# Patient Record
Sex: Female | Born: 1995 | Race: Black or African American | Hispanic: No | Smoking: Former smoker
Health system: Southern US, Community
[De-identification: ages and names within clinical notes are randomized; demographics above are authoritative.]

## PROBLEM LIST (undated history)

## (undated) ENCOUNTER — Inpatient Hospital Stay (HOSPITAL_COMMUNITY): Payer: Self-pay

## (undated) ENCOUNTER — Inpatient Hospital Stay (HOSPITAL_COMMUNITY)

## (undated) DIAGNOSIS — R51 Headache: Secondary | ICD-10-CM

## (undated) DIAGNOSIS — F419 Anxiety disorder, unspecified: Secondary | ICD-10-CM

## (undated) DIAGNOSIS — O008 Other ectopic pregnancy without intrauterine pregnancy: Secondary | ICD-10-CM

## (undated) DIAGNOSIS — A6000 Herpesviral infection of urogenital system, unspecified: Secondary | ICD-10-CM

## (undated) DIAGNOSIS — J302 Other seasonal allergic rhinitis: Secondary | ICD-10-CM

## (undated) DIAGNOSIS — R519 Headache, unspecified: Secondary | ICD-10-CM

## (undated) DIAGNOSIS — Z9889 Other specified postprocedural states: Secondary | ICD-10-CM

## (undated) HISTORY — PX: WISDOM TOOTH EXTRACTION: SHX21

---
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
## 2015-09-18 ENCOUNTER — Encounter (HOSPITAL_COMMUNITY): Payer: Self-pay | Admitting: Emergency Medicine

## 2015-09-18 ENCOUNTER — Ambulatory Visit (HOSPITAL_COMMUNITY)
Admission: EM | Admit: 2015-09-18 | Discharge: 2015-09-18 | Disposition: A | Discharge status: Home / Self Care | Payer: Medicaid Other | Attending: Family Medicine | Admitting: Family Medicine

## 2015-09-18 DIAGNOSIS — T300 Burn of unspecified body region, unspecified degree: Secondary | ICD-10-CM

## 2015-09-18 MED ORDER — SILVER SULFADIAZINE 1 % EX CREA: 1.0000 "application " | TOPICAL_CREAM | Freq: Every day | CUTANEOUS | Status: DC

## 2015-09-18 NOTE — ED Notes (Signed)
Burned left anterior thigh with hot soup.  Skin is peeling, and there are areas for blisters.

## 2015-09-18 NOTE — ED Provider Notes (Signed)
CSN: [id number]     Arrival date & time 09/18/15  1820 History   First MD Initiated Contact with Patient 09/18/15 1847     No chief complaint on file.  (Consider location/radiation/quality/duration/timing/severity/associated sxs/prior Treatment) HPI History obtained from patient: Location:  Left thigh Context/Duration:  About 1 hour ago, spilled hot soup onto thigh while at work. Severity: 4  Quality:burning Timing:   constant         Home Treatment: ice and OTC burn creme Associated symptoms:  Tip of finger burned also Family History:    Past Medical History  Diagnosis Date  . Seasonal allergies   . Headache    Past Surgical History  Procedure Laterality Date  . Wisdom tooth extraction    . No past surgeries     No family history on file. Social History  Substance Use Topics  . Smoking status: Former Smoker    Quit date: 10/18/2012  . Smokeless tobacco: None  . Alcohol Use: No   OB History    Gravida Para Term Preterm AB TAB SAB Ectopic Multiple Living   1 1 1       0 1     Review of Systems  Denies: HEADACHE, NAUSEA, ABDOMINAL PAIN, CHEST PAIN, CONGESTION, DYSURIA, SHORTNESS OF BREATH     Allergies  Review of patient's allergies indicates no known allergies.  Home Medications   Prior to Admission medications   Not on File   Meds Ordered and Administered this Visit  Medications - No data to display  BP 136/86 mmHg  Pulse 90  Temp(Src) 98.9 F (37.2 C) (Oral)  Resp 16  SpO2 100% No data found.   Physical Exam NURSES NOTES AND VITAL SIGNS REVIEWED. CONSTITUTIONAL: Well developed, well nourished, no acute distress HEENT: normocephalic, atraumatic EYES: Conjunctiva normal NECK:normal ROM, supple, no adenopathy PULMONARY:No respiratory distress, normal effort ABDOMINAL: Soft, ND, NT BS+, No CVAT MUSCULOSKELETAL: Normal ROM of all extremities, left anterior thigh 3 cm second degree burn with blisters. Appears to be a splash burn. SKIN: warm and dry  without rash PSYCHIATRIC: Mood and affect, behavior are normal  ED Course  Procedures (including critical care time)  Labs Review Labs Reviewed - No data to display  Imaging Review No results found.   Visual Acuity Review  Right Eye Distance:   Left Eye Distance:   Bilateral Distance:    Right Eye Near:   Left Eye Near:    Bilateral Near:       Silvadene ointment Follow up Thursday for burn Debridement  MDM   1. Burn     Patient is reassured that there are no issues that require transfer to higher level of care at this time or additional tests. Patient is advised to continue home symptomatic treatment. Patient is advised that if there are new or worsening symptoms to attend the emergency department, contact primary care provider, or return to UC. Instructions of care provided discharged home in stable condition.    THIS NOTE WAS GENERATED USING A VOICE RECOGNITION SOFTWARE PROGRAM. ALL REASONABLE EFFORTS  WERE MADE TO PROOFREAD THIS DOCUMENT FOR ACCURACY.  I have verbally reviewed the discharge instructions with the patient. A printed AVS was given to the patient.  All questions were answered prior to discharge.      Tharon AquasFrank C Patrick, PA 09/18/15 1930

## 2015-09-18 NOTE — Discharge Instructions (Signed)
Burn Care °Burns hurt your skin. When your skin is hurt, it is easier to get an infection. Follow your doctor's directions to help prevent an infection. °HOME CARE °· Wash your hands well before you change your bandage. °· Change your bandage as often as told by your doctor. °¨ Remove the old bandage. If the bandage sticks, soak it off with cool, clean water. °¨ Gently clean the burn with mild soap and water. °¨ Pat the burn dry with a clean, dry cloth. °¨ Put a thin layer of medicated cream on the burn. °¨ Put a clean bandage on as told by your doctor. °¨ Keep the bandage clean and dry. °· Raise (elevate) the burn for the first 24 hours. After that, follow your doctor's directions. °· Only take medicine as told by your doctor. °GET HELP RIGHT AWAY IF:  °· You have too much pain. °· The skin near the burn is red, tender, puffy (swollen), or has red streaks. °· The burn area has yellowish [place] fluid (pus) or a bad smell coming from it. °· You have a fever. °MAKE SURE YOU:  °· Understand these instructions. °· Will watch your condition. °· Will get help right away if you are not doing well or get worse. °  °This information is not intended to replace advice given to you by your health care provider. Make sure you discuss any questions you have with your health care provider. °  °Document Released: 12/18/2007 Document Revised: 06/02/2011 Document Reviewed: 07/31/2010 °Elsevier Interactive Patient Education ©2016 [employer] ° °

## 2015-09-20 ENCOUNTER — Encounter (HOSPITAL_COMMUNITY): Payer: Self-pay | Admitting: Emergency Medicine

## 2015-09-20 ENCOUNTER — Ambulatory Visit (HOSPITAL_COMMUNITY)
Admission: EM | Admit: 2015-09-20 | Discharge: 2015-09-20 | Disposition: A | Discharge status: Home / Self Care | Payer: Medicaid Other | Attending: Family Medicine | Admitting: Family Medicine

## 2015-09-20 DIAGNOSIS — T24012D Burn of unspecified degree of left thigh, subsequent encounter: Secondary | ICD-10-CM

## 2015-09-20 MED ORDER — SILVER SULFADIAZINE 1 % EX CREA
TOPICAL_CREAM | Freq: Once | CUTANEOUS | Status: AC
  Administered 2015-09-20: 17:00:00 via TOPICAL

## 2015-09-20 NOTE — ED Provider Notes (Signed)
CSN: [id number]     Arrival date & time 09/20/15  1548 History   None    Chief Complaint  Patient presents with  . Follow-up   (Consider location/radiation/quality/duration/timing/severity/associated sxs/prior Treatment) Patient is a 20 y.o. female presenting with burn.  Burn Burn location:  Leg Leg burn location:  L upper leg Burn quality:  Red and painful Time since incident:  2 days Progression:  Improving Pain details:    Severity:  Mild   Duration:  2 days   Timing:  Constant   Progression:  Unchanged Mechanism of burn:  Hot liquid Incident location:  Kitchen Relieved by:  Salve Worsened by:  Removing the blister Ineffective treatments:  None tried Tetanus status:  Up to date   Past Medical History  Diagnosis Date  . Seasonal allergies   . Headache    Past Surgical History  Procedure Laterality Date  . Wisdom tooth extraction    . No past surgeries     No family history on file. Social History  Substance Use Topics  . Smoking status: Former Smoker    Quit date: 10/18/2012  . Smokeless tobacco: None  . Alcohol Use: No   OB History    Gravida Para Term Preterm AB TAB SAB Ectopic Multiple Living   1 1 1       0 1     Review of Systems  Constitutional: Negative.   HENT: Negative.   Eyes: Negative.   Respiratory: Negative.   Cardiovascular: Negative.   Gastrointestinal: Negative.   Endocrine: Negative.   Genitourinary: Negative.   Musculoskeletal: Negative.   Skin: Positive for wound.  Allergic/Immunologic: Negative.   Neurological: Negative.   Hematological: Negative.   Psychiatric/Behavioral: Negative.     Allergies  Review of patient's allergies indicates no known allergies.  Home Medications   Prior to Admission medications   Medication Sig Start Date End Date Taking? Authorizing Provider  silver sulfADIAZINE (SILVADENE) 1 % cream Apply 1 application topically daily. 09/18/15   Tharon AquasFrank C Patrick, PA   Meds Ordered and Administered this  Visit   Medications  silver sulfADIAZINE (SILVADENE) 1 % cream ( Topical Given 09/20/15 1720)    BP 122/81 mmHg  Pulse 80  Temp(Src) 98.1 F (36.7 C) (Oral)  Resp 16  SpO2 100%  LMP 09/16/2015  Breastfeeding? No No data found.   Physical Exam  Constitutional: She appears well-developed and well-nourished.  HENT:  Head: Normocephalic and atraumatic.  Right Ear: External ear normal.  Left Ear: External ear normal.  Mouth/Throat: Oropharynx is clear and moist.  Eyes: Conjunctivae are normal.  Neck: Normal range of motion. Neck supple.  Cardiovascular: Normal rate, regular rhythm and normal heart sounds.   Pulmonary/Chest: Effort normal and breath sounds normal.  Abdominal: Soft. Bowel sounds are normal.  Skin:  Left upper thigh with area approx 3 cm diameter with erythema and clear drainage and tenderness.  No erythema surrounding.  Surrounding skin slightly peeled back but no eschar or abnormal tissue on the burn wound.  The wound is healing well.  The surrounding tissue is w/o blistering.  The skin is hyperpigemented.    ED Course  Procedures (including critical care time)  Labs Review Labs Reviewed - No data to display  Imaging Review No results found.   Visual Acuity Review  Right Eye Distance:   Left Eye Distance:   Bilateral Distance:    Right Eye Near:   Left Eye Near:    Bilateral Near:  MDM   1. Burn of left thigh, unspecified degree, subsequent encounter    Silvadene ointment applied to left thigh burn and then nonadaptic or telfa pad then coban wrap placed. Patient advised to take tylenol and motrin over the counter as directed for pain.  Continue daily dressing changes.  Follow up prn.    Deatra CanterWilliam J Oxford, FNP 09/20/15 1730

## 2015-09-20 NOTE — ED Notes (Addendum)
Here for a f/u to a 2nd degree burn to LLE... Voices no new concern.... A&O x4... NAD

## 2015-09-20 NOTE — Discharge Instructions (Signed)
Burn Care °Burns hurt your skin. When your skin is hurt, it is easier to get an infection. Follow your doctor's directions to help prevent an infection. °HOME CARE °· Wash your hands well before you change your bandage. °· Change your bandage as often as told by your doctor. °¨ Remove the old bandage. If the bandage sticks, soak it off with cool, clean water. °¨ Gently clean the burn with mild soap and water. °¨ Pat the burn dry with a clean, dry cloth. °¨ Put a thin layer of medicated cream on the burn. °¨ Put a clean bandage on as told by your doctor. °¨ Keep the bandage clean and dry. °· Raise (elevate) the burn for the first 24 hours. After that, follow your doctor's directions. °· Only take medicine as told by your doctor. °GET HELP RIGHT AWAY IF:  °· You have too much pain. °· The skin near the burn is red, tender, puffy (swollen), or has red streaks. °· The burn area has yellowish [place] fluid (pus) or a bad smell coming from it. °· You have a fever. °MAKE SURE YOU:  °· Understand these instructions. °· Will watch your condition. °· Will get help right away if you are not doing well or get worse. °  °This information is not intended to replace advice given to you by your health care provider. Make sure you discuss any questions you have with your health care provider. °  °Document Released: 12/18/2007 Document Revised: 06/02/2011 Document Reviewed: 07/31/2010 °Elsevier Interactive Patient Education ©2016 Elsevier Inc. ° °

## 2015-09-20 NOTE — ED Notes (Signed)
Applied silvadene and 4x3 telfa and secured w/4 in coban

## 2016-12-22 ENCOUNTER — Encounter (HOSPITAL_COMMUNITY): Payer: Self-pay | Admitting: Emergency Medicine

## 2016-12-22 ENCOUNTER — Emergency Department (HOSPITAL_COMMUNITY)
Admission: EM | Admit: 2016-12-22 | Discharge: 2016-12-22 | Disposition: A | Discharge status: Home / Self Care | Payer: Medicaid Other | Attending: Emergency Medicine | Admitting: Emergency Medicine

## 2016-12-22 DIAGNOSIS — Z87891 Personal history of nicotine dependence: Secondary | ICD-10-CM | POA: Diagnosis not present

## 2016-12-22 DIAGNOSIS — Z79899 Other long term (current) drug therapy: Secondary | ICD-10-CM | POA: Diagnosis not present

## 2016-12-22 DIAGNOSIS — B009 Herpesviral infection, unspecified: Secondary | ICD-10-CM

## 2016-12-22 DIAGNOSIS — R102 Pelvic and perineal pain: Secondary | ICD-10-CM | POA: Insufficient documentation

## 2016-12-22 LAB — URINALYSIS, ROUTINE W REFLEX MICROSCOPIC
BILIRUBIN URINE: NEGATIVE
Glucose, UA: NEGATIVE mg/dL
Hgb urine dipstick: NEGATIVE
Ketones, ur: NEGATIVE mg/dL
Nitrite: NEGATIVE
PH: 5 (ref 5.0–8.0)
Protein, ur: NEGATIVE mg/dL
Specific Gravity, Urine: 1.023 (ref 1.005–1.030)

## 2016-12-22 LAB — POC URINE PREG, ED: PREG TEST UR: NEGATIVE

## 2016-12-22 MED ORDER — ACYCLOVIR 400 MG PO TABS: 400.0000 mg | ORAL_TABLET | Freq: Every day | ORAL | 0 refills | Status: AC

## 2016-12-22 NOTE — ED Provider Notes (Signed)
[facility]-EMERGENCY DEPT Provider Note   CSN: [id number] Arrival date & time: 12/22/16  1111     History   Chief Complaint Chief Complaint  Patient presents with  . Pelvic Pain    HPI Sara Rubio is a 21 y.o. female.  HPI  The patient is a 21 year old female who states that she is otherwise healthy, she was tested approximately one week ago for STDs at the [facility] because of increased vaginal discharge and dysuria. Ultimately she was given sulfamethoxazole for a UTI and Flagyl for a vaginal infection but because she was drinking alcohol she has not taken the Flagyl until today. She has now developed a left sided labial lesion which is blistering and tender to the touch and is concerned that she may have herpes. She also complains of mild left lower quadrant discomfort which has been persistent over the last couple of weeks. It is not worsening. No fevers chills nausea vomiting dysuria or diarrhea or rectal bleeding.  Past Medical History:  Diagnosis Date  . Headache   . Seasonal allergies     Patient Active Problem List   Diagnosis Date Noted  . Normal labor 10/20/2014  . NVD (normal vaginal delivery) 10/20/2014  . Ketonuria 04/24/2014  . Nausea and vomiting during pregnancy 04/24/2014    Past Surgical History:  Procedure Laterality Date  . NO PAST SURGERIES    . WISDOM TOOTH EXTRACTION      OB History    Gravida Para Term Preterm AB Living   SAB TAB Ectopic Multiple Live Births         0 1       Home Medications    Prior to Admission medications   Medication Sig Start Date End Date Taking? Authorizing Provider  acyclovir (ZOVIRAX) 400 MG tablet Take 1 tablet (400 mg total) by mouth 5 (five) times daily. 12/22/16 12/29/16  Eber Hong, MD  silver sulfADIAZINE (SILVADENE) 1 % cream Apply 1 application topically daily. 09/18/15   Tharon Aquas, PA    Family History History reviewed. No pertinent family history.  Social  History Social History  Substance Use Topics  . Smoking status: Former Smoker    Quit date: 10/18/2012  . Smokeless tobacco: Never Used  . Alcohol use No     Allergies   Patient has no known allergies.   Review of Systems Review of Systems  Constitutional: Negative for chills and fever.  Respiratory: Negative for cough and shortness of breath.   Cardiovascular: Negative for chest pain.  Gastrointestinal: Positive for abdominal pain. Negative for constipation, diarrhea, nausea, rectal pain and vomiting.  Genitourinary: Positive for dyspareunia, genital sores and vaginal discharge. Negative for difficulty urinating, dysuria and flank pain.  Musculoskeletal: Negative for back pain.  Skin: Positive for rash.     Physical Exam Updated Vital Signs BP (!) 123/56 (BP Location: Left Arm)   Pulse (!) 102   Temp 98.5 F (36.9 C) (Oral)   Resp 20   LMP 12/09/2016   SpO2 100%   Physical Exam  Constitutional: She appears well-developed and well-nourished. No distress.  HENT:  Head: Normocephalic and atraumatic.  Mouth/Throat: Oropharynx is clear and moist. No oropharyngeal exudate.  Eyes: Pupils are equal, round, and reactive to light. Conjunctivae and EOM are normal. Right eye exhibits no discharge. Left eye exhibits no discharge. No scleral icterus.  Neck: Normal range of motion. Neck supple. No JVD present. No thyromegaly present.  Cardiovascular: Normal rate, regular rhythm, normal heart sounds and intact distal pulses.  Exam reveals no gallop and no friction rub.   No murmur heard. Pulmonary/Chest: Effort normal and breath sounds normal. No respiratory distress. She has no wheezes. She has no rales.  Abdominal: Soft. Bowel sounds are normal. She exhibits no distension and no mass. There is tenderness ( Patient states she has mild left lower quadrant tenderness but no guarding masses or peritoneal signs. Essentially nontender on exam).  Genitourinary:  Genitourinary Comments:  Chaperone present for exam, there is a slight blistering-type lesion in the left labia just to the outside of this area. There is no open wounds or ulcers. There is a small mount of yellow vaginal discharge, there is no other labial abscesses present.  Musculoskeletal: Normal range of motion. She exhibits no edema or tenderness.  Lymphadenopathy:    She has no cervical adenopathy.  Neurological: She is alert. Coordination normal.  Skin: Skin is warm and dry. No rash noted. No erythema.  Psychiatric: She has a normal mood and affect. Her behavior is normal.  Nursing note and vitals reviewed.    ED Treatments / Results  Labs (all labs ordered are listed, but only abnormal results are displayed) Labs Reviewed  URINALYSIS, ROUTINE W REFLEX MICROSCOPIC - Abnormal; Notable for the following:       Result Value   APPearance HAZY (*)    Leukocytes, UA LARGE (*)    Bacteria, UA RARE (*)    Squamous Epithelial / LPF 0-5 (*)    All other components within normal limits  POC URINE PREG, ED    Radiology No results found.  Procedures Procedures (including critical care time)  Medications Ordered in ED Medications - No data to display   Initial Impression / Assessment and Plan / ED Course  I have reviewed the triage vital signs and the nursing notes.  Pertinent labs & imaging results that were available during my care of the patient were reviewed by me and considered in my medical decision making (see chart for details).     The patient has what appears to be herpes simplex, she will be treated with acyclovir. She was informed of the treatment protocols as well as her need to practice safe sex. She will be given prescriptions for acyclovir. Follow up with health department. She states that she tested negative for gonorrhea Chlamydia syphilis and HIV while she was there last week. This rash seems to have come up since that time. She does not know of anybody that might of given it to  her.  Final Clinical Impressions(s) / ED Diagnoses   Final diagnoses:  Pelvic pain in female  Herpes simplex infection    New Prescriptions New Prescriptions   ACYCLOVIR (ZOVIRAX) 400 MG TABLET    Take 1 tablet (400 mg total) by mouth 5 (five) times daily.     Eber Hong, MD 12/22/16 [phone]

## 2016-12-22 NOTE — Discharge Instructions (Signed)
[employer] - for coupons  Your rash is similar to the rash that you get with herpes. Please take acyclovir, 400 mg 5 times a day for the next 7 days. Please read the attached instructions  you will need to be formally diagnosed at a place like the health Department where they can test for infections like herpes.

## 2016-12-22 NOTE — ED Triage Notes (Signed)
Pt to ER, reports pelvic pain x1 week, was seen for same last week and diagnosed with BV, only began medications for treatment today. Reports vaginal discharge. LMP 09/18

## 2017-05-13 ENCOUNTER — Encounter: Payer: Self-pay | Admitting: *Deleted

## 2017-05-13 ENCOUNTER — Ambulatory Visit (INDEPENDENT_AMBULATORY_CARE_PROVIDER_SITE_OTHER): Payer: Medicaid Other | Admitting: *Deleted

## 2017-05-13 DIAGNOSIS — Z3201 Encounter for pregnancy test, result positive: Secondary | ICD-10-CM | POA: Diagnosis present

## 2017-05-13 DIAGNOSIS — Z3200 Encounter for pregnancy test, result unknown: Secondary | ICD-10-CM

## 2017-05-13 LAB — POCT PREGNANCY, URINE: PREG TEST UR: POSITIVE — AB

## 2017-05-13 NOTE — Progress Notes (Signed)
Here for pregnancy test which was postive. States LMP 04/05/17. States sure of LMP and has regular periods. Reviewed meds with her.Verified with provider Judeth HornErin Lawrence, NP valtrex ok to take in pregnancy if needed.  Encouraged her to start prenatal care asap.

## 2017-05-13 NOTE — Progress Notes (Signed)
Chart reviewed for nurse visit. Agree with plan of care.   Judeth HornLawrence, Melony Tenpas, NP 05/13/2017 12:33 PM

## 2017-05-16 ENCOUNTER — Inpatient Hospital Stay (HOSPITAL_COMMUNITY): Payer: Medicaid Other | Admitting: Anesthesiology

## 2017-05-16 ENCOUNTER — Encounter (HOSPITAL_COMMUNITY): Payer: Self-pay | Admitting: *Deleted

## 2017-05-16 ENCOUNTER — Inpatient Hospital Stay (HOSPITAL_COMMUNITY)
Admission: AD | Admit: 2017-05-16 | Discharge: 2017-05-17 | DRG: 817 | Disposition: A | Discharge status: Home / Self Care | Payer: Medicaid Other | Source: Ambulatory Visit | Attending: Obstetrics & Gynecology | Admitting: Obstetrics & Gynecology

## 2017-05-16 ENCOUNTER — Other Ambulatory Visit: Payer: Self-pay

## 2017-05-16 ENCOUNTER — Inpatient Hospital Stay (HOSPITAL_COMMUNITY): Payer: Medicaid Other

## 2017-05-16 ENCOUNTER — Encounter (HOSPITAL_COMMUNITY)
Admission: AD | Disposition: A | Discharge status: Home / Self Care | Payer: Self-pay | Source: Ambulatory Visit | Attending: Obstetrics & Gynecology

## 2017-05-16 DIAGNOSIS — R109 Unspecified abdominal pain: Secondary | ICD-10-CM

## 2017-05-16 DIAGNOSIS — O008 Other ectopic pregnancy without intrauterine pregnancy: Secondary | ICD-10-CM | POA: Diagnosis present

## 2017-05-16 DIAGNOSIS — O00102 Left tubal pregnancy without intrauterine pregnancy: Principal | ICD-10-CM | POA: Diagnosis present

## 2017-05-16 DIAGNOSIS — Z87891 Personal history of nicotine dependence: Secondary | ICD-10-CM

## 2017-05-16 DIAGNOSIS — O98511 Other viral diseases complicating pregnancy, first trimester: Secondary | ICD-10-CM

## 2017-05-16 DIAGNOSIS — A6000 Herpesviral infection of urogenital system, unspecified: Secondary | ICD-10-CM | POA: Diagnosis present

## 2017-05-16 DIAGNOSIS — O26891 Other specified pregnancy related conditions, first trimester: Secondary | ICD-10-CM

## 2017-05-16 DIAGNOSIS — K661 Hemoperitoneum: Secondary | ICD-10-CM | POA: Diagnosis present

## 2017-05-16 DIAGNOSIS — Z3A01 Less than 8 weeks gestation of pregnancy: Secondary | ICD-10-CM

## 2017-05-16 DIAGNOSIS — B009 Herpesviral infection, unspecified: Secondary | ICD-10-CM

## 2017-05-16 DIAGNOSIS — F419 Anxiety disorder, unspecified: Secondary | ICD-10-CM | POA: Diagnosis present

## 2017-05-16 DIAGNOSIS — Z9889 Other specified postprocedural states: Secondary | ICD-10-CM

## 2017-05-16 HISTORY — PX: UNILATERAL SALPINGECTOMY: SHX6160

## 2017-05-16 HISTORY — DX: Other ectopic pregnancy without intrauterine pregnancy: O00.80

## 2017-05-16 HISTORY — DX: Herpesviral infection of urogenital system, unspecified: A60.00

## 2017-05-16 HISTORY — DX: Other specified postprocedural states: Z98.890

## 2017-05-16 HISTORY — PX: LAPAROTOMY: SHX154

## 2017-05-16 HISTORY — DX: Anxiety disorder, unspecified: F41.9

## 2017-05-16 HISTORY — PX: LAPAROSCOPY: SHX197

## 2017-05-16 LAB — CBC WITH DIFFERENTIAL/PLATELET
BASOS PCT: 0 %
Basophils Absolute: 0 10*3/uL (ref 0.0–0.1)
EOS ABS: 0 10*3/uL (ref 0.0–0.7)
Eosinophils Relative: 0 %
HCT: 36.3 % (ref 36.0–46.0)
Hemoglobin: 12.6 g/dL (ref 12.0–15.0)
LYMPHS ABS: 2.1 10*3/uL (ref 0.7–4.0)
Lymphocytes Relative: 25 %
MCH: 31.3 pg (ref 26.0–34.0)
MCHC: 34.7 g/dL (ref 30.0–36.0)
MCV: 90.3 fL (ref 78.0–100.0)
MONO ABS: 0.3 10*3/uL (ref 0.1–1.0)
Monocytes Relative: 3 %
Neutro Abs: 6 10*3/uL (ref 1.7–7.7)
Neutrophils Relative %: 72 %
Platelets: 246 10*3/uL (ref 150–400)
RBC: 4.02 MIL/uL (ref 3.87–5.11)
RDW: 12.7 % (ref 11.5–15.5)
WBC: 8.4 10*3/uL (ref 4.0–10.5)

## 2017-05-16 LAB — WET PREP, GENITAL
Sperm: NONE SEEN
Trich, Wet Prep: NONE SEEN

## 2017-05-16 LAB — URINALYSIS, ROUTINE W REFLEX MICROSCOPIC
BACTERIA UA: NONE SEEN
BILIRUBIN URINE: NEGATIVE
GLUCOSE, UA: NEGATIVE mg/dL
HGB URINE DIPSTICK: NEGATIVE
KETONES UR: NEGATIVE mg/dL
NITRITE: NEGATIVE
PROTEIN: NEGATIVE mg/dL
Specific Gravity, Urine: 1.019 (ref 1.005–1.030)
pH: 5 (ref 5.0–8.0)

## 2017-05-16 LAB — PREPARE RBC (CROSSMATCH)

## 2017-05-16 LAB — POCT PREGNANCY, URINE: Preg Test, Ur: POSITIVE — AB

## 2017-05-16 LAB — HCG, QUANTITATIVE, PREGNANCY: hCG, Beta Chain, Quant, S: 23920 m[IU]/mL — ABNORMAL HIGH (ref <5)

## 2017-05-16 SURGERY — LAPAROSCOPY OPERATIVE
Anesthesia: General | Site: Abdomen

## 2017-05-16 MED ORDER — FAMOTIDINE IN NACL 20-0.9 MG/50ML-% IV SOLN
20.0000 mg | Freq: Once | INTRAVENOUS | Status: AC
  Administered 2017-05-16: 20 mg via INTRAVENOUS
  Filled 2017-05-16: qty 50

## 2017-05-16 MED ORDER — HYDROMORPHONE HCL 1 MG/ML IJ SOLN
INTRAMUSCULAR | Status: AC
  Filled 2017-05-16: qty 1

## 2017-05-16 MED ORDER — PROPOFOL 10 MG/ML IV BOLUS
INTRAVENOUS | Status: DC | PRN
  Administered 2017-05-16: 200 mg via INTRAVENOUS

## 2017-05-16 MED ORDER — ROCURONIUM BROMIDE 100 MG/10ML IV SOLN
INTRAVENOUS | Status: AC
  Filled 2017-05-16: qty 1

## 2017-05-16 MED ORDER — PROPOFOL 10 MG/ML IV BOLUS
INTRAVENOUS | Status: AC
  Filled 2017-05-16: qty 20

## 2017-05-16 MED ORDER — ONDANSETRON HCL 4 MG/2ML IJ SOLN
INTRAMUSCULAR | Status: AC
  Filled 2017-05-16: qty 2

## 2017-05-16 MED ORDER — FENTANYL CITRATE (PF) 100 MCG/2ML IJ SOLN
INTRAMUSCULAR | Status: AC
  Filled 2017-05-16: qty 2

## 2017-05-16 MED ORDER — LORAZEPAM 2 MG/ML IJ SOLN
1.0000 mg | Freq: Four times a day (QID) | INTRAMUSCULAR | Status: DC | PRN
  Administered 2017-05-16: 1 mg via INTRAVENOUS
  Filled 2017-05-16 (×2): qty 0.5

## 2017-05-16 MED ORDER — BUPIVACAINE HCL (PF) 0.5 % IJ SOLN
INTRAMUSCULAR | Status: DC | PRN
  Administered 2017-05-16: 2 mL
  Administered 2017-05-16: 28 mL

## 2017-05-16 MED ORDER — ROCURONIUM BROMIDE 100 MG/10ML IV SOLN
INTRAVENOUS | Status: DC | PRN
  Administered 2017-05-16: 30 mg via INTRAVENOUS
  Administered 2017-05-16: 20 mg via INTRAVENOUS

## 2017-05-16 MED ORDER — DEXAMETHASONE SODIUM PHOSPHATE 10 MG/ML IJ SOLN
INTRAMUSCULAR | Status: DC | PRN
  Administered 2017-05-16: 4 mg via INTRAVENOUS

## 2017-05-16 MED ORDER — SUGAMMADEX SODIUM 200 MG/2ML IV SOLN
INTRAVENOUS | Status: DC | PRN
  Administered 2017-05-16: 200 mg via INTRAVENOUS

## 2017-05-16 MED ORDER — OXYCODONE-ACETAMINOPHEN 5-325 MG PO TABS
1.0000 | ORAL_TABLET | Freq: Once | ORAL | Status: AC
  Administered 2017-05-16: 1 via ORAL
  Filled 2017-05-16: qty 1

## 2017-05-16 MED ORDER — LIDOCAINE HCL (CARDIAC) 20 MG/ML IV SOLN
INTRAVENOUS | Status: DC | PRN
  Administered 2017-05-16: 100 mg via INTRAVENOUS

## 2017-05-16 MED ORDER — SCOPOLAMINE 1 MG/3DAYS TD PT72
MEDICATED_PATCH | TRANSDERMAL | Status: DC | PRN
  Administered 2017-05-16: 1 via TRANSDERMAL

## 2017-05-16 MED ORDER — LACTATED RINGERS IV SOLN: INTRAVENOUS | Status: DC

## 2017-05-16 MED ORDER — FENTANYL CITRATE (PF) 100 MCG/2ML IJ SOLN
INTRAMUSCULAR | Status: DC | PRN
  Administered 2017-05-16: 100 ug via INTRAVENOUS
  Administered 2017-05-16: 50 ug via INTRAVENOUS
  Administered 2017-05-16: 25 ug via INTRAVENOUS
  Administered 2017-05-16: 50 ug via INTRAVENOUS
  Administered 2017-05-16: 25 ug via INTRAVENOUS
  Administered 2017-05-16 (×2): 100 ug via INTRAVENOUS

## 2017-05-16 MED ORDER — MIDAZOLAM HCL 2 MG/2ML IJ SOLN
INTRAMUSCULAR | Status: DC | PRN
  Administered 2017-05-16: 2 mg via INTRAVENOUS

## 2017-05-16 MED ORDER — MIDAZOLAM HCL 2 MG/2ML IJ SOLN
INTRAMUSCULAR | Status: AC
  Filled 2017-05-16: qty 2

## 2017-05-16 MED ORDER — GLYCOPYRROLATE 0.2 MG/ML IJ SOLN
INTRAMUSCULAR | Status: AC
  Filled 2017-05-16: qty 1

## 2017-05-16 MED ORDER — LIDOCAINE HCL (CARDIAC) 20 MG/ML IV SOLN
INTRAVENOUS | Status: AC
  Filled 2017-05-16: qty 5

## 2017-05-16 MED ORDER — SUGAMMADEX SODIUM 200 MG/2ML IV SOLN
INTRAVENOUS | Status: AC
  Filled 2017-05-16: qty 2

## 2017-05-16 MED ORDER — MEPERIDINE HCL 25 MG/ML IJ SOLN
6.2500 mg | INTRAMUSCULAR | Status: DC | PRN
  Administered 2017-05-16 (×2): 12.5 mg via INTRAVENOUS

## 2017-05-16 MED ORDER — SOD CITRATE-CITRIC ACID 500-334 MG/5ML PO SOLN
30.0000 mL | Freq: Once | ORAL | Status: AC
  Administered 2017-05-16: 30 mL via ORAL
  Filled 2017-05-16: qty 15

## 2017-05-16 MED ORDER — MEPERIDINE HCL 25 MG/ML IJ SOLN
INTRAMUSCULAR | Status: AC
  Filled 2017-05-16: qty 1

## 2017-05-16 MED ORDER — DEXAMETHASONE SODIUM PHOSPHATE 4 MG/ML IJ SOLN
INTRAMUSCULAR | Status: AC
  Filled 2017-05-16: qty 1

## 2017-05-16 MED ORDER — CEFAZOLIN SODIUM-DEXTROSE 2-4 GM/100ML-% IV SOLN
2.0000 g | INTRAVENOUS | Status: AC
  Administered 2017-05-16: 2 g via INTRAVENOUS
  Filled 2017-05-16: qty 100

## 2017-05-16 MED ORDER — SUCCINYLCHOLINE CHLORIDE 20 MG/ML IJ SOLN
INTRAMUSCULAR | Status: DC | PRN
  Administered 2017-05-16: 100 mg via INTRAVENOUS

## 2017-05-16 MED ORDER — OXYCODONE-ACETAMINOPHEN 5-325 MG PO TABS
2.0000 | ORAL_TABLET | Freq: Once | ORAL | Status: DC
  Filled 2017-05-16: qty 2

## 2017-05-16 MED ORDER — FENTANYL CITRATE (PF) 250 MCG/5ML IJ SOLN
INTRAMUSCULAR | Status: AC
  Filled 2017-05-16: qty 5

## 2017-05-16 MED ORDER — KETOROLAC TROMETHAMINE 30 MG/ML IJ SOLN
INTRAMUSCULAR | Status: DC | PRN
  Administered 2017-05-16: 30 mg via INTRAVENOUS

## 2017-05-16 MED ORDER — HYDROMORPHONE HCL 1 MG/ML IJ SOLN
INTRAMUSCULAR | Status: DC | PRN
  Administered 2017-05-16: 1 mg via INTRAVENOUS

## 2017-05-16 MED ORDER — SODIUM CHLORIDE 0.9 % IR SOLN
Status: DC | PRN
  Administered 2017-05-16: 3000 mL

## 2017-05-16 MED ORDER — ONDANSETRON HCL 4 MG/2ML IJ SOLN
INTRAMUSCULAR | Status: DC | PRN
  Administered 2017-05-16: 4 mg via INTRAVENOUS

## 2017-05-16 MED ORDER — FENTANYL CITRATE (PF) 100 MCG/2ML IJ SOLN
25.0000 ug | INTRAMUSCULAR | Status: DC | PRN
  Administered 2017-05-16 (×2): 50 ug via INTRAVENOUS

## 2017-05-16 MED ORDER — LACTATED RINGERS IV SOLN
INTRAVENOUS | Status: DC
  Administered 2017-05-16 (×3): via INTRAVENOUS

## 2017-05-16 MED ORDER — METOCLOPRAMIDE HCL 5 MG/ML IJ SOLN: 10.0000 mg | Freq: Once | INTRAMUSCULAR | Status: DC | PRN

## 2017-05-16 MED ORDER — GLYCOPYRROLATE 0.2 MG/ML IJ SOLN
INTRAMUSCULAR | Status: DC | PRN
  Administered 2017-05-16: .1 mg via INTRAVENOUS
  Administered 2017-05-16: 0.1 mg via INTRAVENOUS

## 2017-05-16 SURGICAL SUPPLY — 44 items
ADH SKN CLS APL DERMABOND .7 (GAUZE/BANDAGES/DRESSINGS) ×2
BAG SPEC RTRVL LRG 6X4 10 (ENDOMECHANICALS)
CABLE HIGH FREQUENCY MONO STRZ (ELECTRODE) IMPLANT
CANISTER SUCT 3000ML PPV (MISCELLANEOUS) ×1 IMPLANT
COVER MAYO STAND STRL (DRAPES) ×1 IMPLANT
DERMABOND ADVANCED (GAUZE/BANDAGES/DRESSINGS) ×1
DERMABOND ADVANCED .7 DNX12 (GAUZE/BANDAGES/DRESSINGS) ×2 IMPLANT
DRSG OPSITE POSTOP 3X4 (GAUZE/BANDAGES/DRESSINGS) ×1 IMPLANT
DURAPREP 26ML APPLICATOR (WOUND CARE) ×3 IMPLANT
GLOVE BIOGEL PI IND STRL 7.0 (GLOVE) ×8 IMPLANT
GLOVE BIOGEL PI INDICATOR 7.0 (GLOVE) ×4
GLOVE ECLIPSE 7.0 STRL STRAW (GLOVE) ×3 IMPLANT
GOWN STRL REUS W/TWL LRG LVL3 (GOWN DISPOSABLE) ×10 IMPLANT
HEMOSTAT SURGICEL 4X8 (HEMOSTASIS) ×1 IMPLANT
NEEDLE INSUFFLATION 120MM (ENDOMECHANICALS) IMPLANT
NS IRRIG 1000ML POUR BTL (IV SOLUTION) ×3 IMPLANT
PACK LAPAROSCOPY BASIN (CUSTOM PROCEDURE TRAY) ×3 IMPLANT
PACK TRENDGUARD 450 HYBRID PRO (MISCELLANEOUS) IMPLANT
PACK TRENDGUARD 600 HYBRD PROC (MISCELLANEOUS) IMPLANT
PENCIL BUTTON HOLSTER BLD 10FT (ELECTRODE) ×1 IMPLANT
POUCH SPECIMEN RETRIEVAL 10MM (ENDOMECHANICALS) IMPLANT
PROTECTOR NERVE ULNAR (MISCELLANEOUS) ×7 IMPLANT
SET IRRIG TUBING LAPAROSCOPIC (IRRIGATION / IRRIGATOR) ×1 IMPLANT
SHEARS HARMONIC ACE PLUS 36CM (ENDOMECHANICALS) ×1 IMPLANT
SLEEVE XCEL OPT CAN 5 100 (ENDOMECHANICALS) ×3 IMPLANT
SPONGE LAP 18X18 X RAY DECT (DISPOSABLE) ×3 IMPLANT
SUT VIC AB 0 CT1 18XCR BRD8 (SUTURE) IMPLANT
SUT VIC AB 0 CT1 27 (SUTURE) ×12
SUT VIC AB 0 CT1 27XBRD ANBCTR (SUTURE) IMPLANT
SUT VIC AB 0 CT1 8-18 (SUTURE) ×6
SUT VIC AB 2-0 SH 27 (SUTURE) ×6
SUT VIC AB 2-0 SH 27XBRD (SUTURE) IMPLANT
SUT VICRYL 0 TIES 12 18 (SUTURE) ×1 IMPLANT
SUT VICRYL 0 UR6 27IN ABS (SUTURE) ×6 IMPLANT
SUT VICRYL 4-0 PS2 18IN ABS (SUTURE) ×3 IMPLANT
TOWEL OR 17X24 6PK STRL BLUE (TOWEL DISPOSABLE) ×6 IMPLANT
TRAY FOLEY CATH SILVER 14FR (SET/KITS/TRAYS/PACK) ×3 IMPLANT
TRENDGUARD 450 HYBRID PRO PACK (MISCELLANEOUS) ×3
TRENDGUARD 600 HYBRID PROC PK (MISCELLANEOUS)
TROCAR XCEL NON-BLD 11X100MML (ENDOMECHANICALS) ×3 IMPLANT
TROCAR XCEL NON-BLD 5MMX100MML (ENDOMECHANICALS) ×3 IMPLANT
TUBING NON-CON 1/4 X 20 CONN (TUBING) ×1 IMPLANT
WARMER LAPAROSCOPE (MISCELLANEOUS) ×3 IMPLANT
YANKAUER SUCT BULB TIP NO VENT (SUCTIONS) ×1 IMPLANT

## 2017-05-16 NOTE — Anesthesia Procedure Notes (Signed)
Procedure Name: Intubation Date/Time: 05/16/2017 8:13 PM Performed by: Genevie Ann, CRNA Pre-anesthesia Checklist: Patient identified, Emergency Drugs available, Suction available, Patient being monitored and Timeout performed Patient Re-evaluated:Patient Re-evaluated prior to induction Oxygen Delivery Method: Circle system utilized Preoxygenation: Pre-oxygenation with 100% oxygen Induction Type: IV induction Laryngoscope Size: Mac and 3 Grade View: Grade I Tube size: 7.0 mm Number of attempts: 1 Placement Confirmation: ETT inserted through vocal cords under direct vision,  positive ETCO2 and breath sounds checked- equal and bilateral Secured at: 20 cm Dental Injury: Teeth and Oropharynx as per pre-operative assessment

## 2017-05-16 NOTE — MAU Provider Note (Signed)
History   G2P1001 @ 5.6 wks by LMP in with severe low abd pain with sudden onset. 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  CSN: [id number]  Arrival date & time 05/16/17  1720   None     Chief Complaint  Patient presents with  . Abdominal Pain    HPI  Past Medical History:  Diagnosis Date  . Headache   . Seasonal allergies     Past Surgical History:  Procedure Laterality Date  . NO PAST SURGERIES    . WISDOM TOOTH EXTRACTION      No family history on file.  Social History   Tobacco Use  . Smoking status: Former Smoker    Last attempt to quit: 10/18/2012    Years since quitting: 4.5  . Smokeless tobacco: Never Used  Substance Use Topics  . Alcohol use: No  . Drug use: Yes    Types: Marijuana    Comment: last useDec 1 2015    OB History    Gravida Para Term Preterm AB Living   2 1 1     1    SAB TAB Ectopic Multiple Live Births         0 1      Review of Systems  Constitutional: Negative.   HENT: Negative.   Eyes: Negative.   Cardiovascular: Negative.   Gastrointestinal: Positive for abdominal pain.  Endocrine: Negative.   Genitourinary: Positive for genital sores and vaginal pain.  Musculoskeletal: Negative.   Skin: Negative.   Allergic/Immunologic: Negative.   Neurological: Negative.   Hematological: Negative.   Psychiatric/Behavioral: Negative.     Allergies  Patient has no known allergies.  Home Medications    BP (!) 115/54 (BP Location: Left Arm)   Pulse (!) 104   Temp 98 F (36.7 C) (Oral)   Resp 18   Ht 5\' 8"  (1.727 m)   Wt 185 lb (83.9 kg)   LMP 04/05/2017   BMI 28.13 kg/m   Physical Exam  Constitutional: She is oriented to person, place, and time. She appears well-developed and well-nourished.  HENT:  Head: Normocephalic.  Eyes: Pupils are equal, round, and reactive to light.  Neck: Normal range of motion.  Cardiovascular: Normal rate, regular rhythm, normal heart sounds and intact distal pulses.   Pulmonary/Chest: Effort normal and breath sounds normal.  Abdominal: Soft. There is tenderness.  Genitourinary:  Genitourinary Comments: Multiple ulcerative lesions on perineum and labias  Musculoskeletal: Normal range of motion.  Neurological: She is alert and oriented to person, place, and time. She has normal reflexes.  Skin: Skin is warm and dry.  Psychiatric: She has a normal mood and affect. Her behavior is normal. Judgment and thought content normal.    MAU Course  Procedures (including critical care time)  Labs Reviewed  WET PREP, GENITAL  URINALYSIS, ROUTINE W REFLEX MICROSCOPIC  GC/CHLAMYDIA PROBE AMP ([facility]) NOT AT [facility]   No results found.   1. Abdominal pain in pregnancy, first trimester   2. Herpes infection during pregnancy in first trimester       MDM  VSS, multiple ulcerative lesions on labia and perineum. abd soft but very tender with exam. Wet prep . Cultures obtained. [place] shows left ectopic preg . Dr. Macon LargeAnyanwu made aware. Diagnosis discussed with pt and aware and understands.

## 2017-05-16 NOTE — Transfer of Care (Signed)
Immediate Anesthesia Transfer of Care Note  Patient: Sara Rubio  Procedure(s) Performed: LAPAROSCOPY DIAGNOSTIC (N/A Abdomen) EXPLORATORY LAPAROTOMY, LEFT CORNUAL WEDGE RESECTION ECTOPIC PREGNANCY (N/A Abdomen) UNILATERAL SALPINGECTOMY (Left Abdomen)  Patient Location: PACU  Anesthesia Type:General  Level of Consciousness: awake, alert  and oriented  Airway & Oxygen Therapy: Patient Spontanous Breathing and Patient connected to nasal cannula oxygen  Post-op Assessment: Report given to RN and Post -op Vital signs reviewed and stable  Post vital signs: Reviewed and stable  Last Vitals:  Vitals:   05/16/17 1741  BP: (!) 115/54  Pulse: (!) 104  Resp: 18  Temp: 36.7 C    Last Pain:  Vitals:   05/16/17 1746  TempSrc:   PainSc: 8          Complications: No apparent anesthesia complications

## 2017-05-16 NOTE — Anesthesia Preprocedure Evaluation (Signed)
Anesthesia Evaluation  Patient identified by MRN, date of birth, ID band Patient awake    Reviewed: Allergy & Precautions, NPO status , Patient's Chart, lab work & pertinent test results  Airway Mallampati: II  TM Distance: >3 FB Neck ROM: Full    Dental no notable dental hx.    Pulmonary neg pulmonary ROS, former smoker,    Pulmonary exam normal breath sounds clear to auscultation       Cardiovascular negative cardio ROS Normal cardiovascular exam Rhythm:Regular Rate:Normal     Neuro/Psych negative neurological ROS  negative psych ROS   GI/Hepatic negative GI ROS, Neg liver ROS,   Endo/Other  negative endocrine ROS  Renal/GU negative Renal ROS  negative genitourinary   Musculoskeletal negative musculoskeletal ROS (+)   Abdominal   Peds negative pediatric ROS (+)  Hematology negative hematology ROS (+)   Anesthesia Other Findings   Reproductive/Obstetrics negative OB ROS                             Anesthesia Physical Anesthesia Plan  ASA: I and emergent  Anesthesia Plan: General   Post-op Pain Management:    Induction: Intravenous  PONV Risk Score and Plan: 3 and Ondansetron and Midazolam  Airway Management Planned: Oral ETT  Additional Equipment:   Intra-op Plan:   Post-operative Plan: Extubation in OR  Informed Consent: I have reviewed the patients History and Physical, chart, labs and discussed the procedure including the risks, benefits and alternatives for the proposed anesthesia with the patient or authorized representative who has indicated his/her understanding and acceptance.   Dental advisory given  Plan Discussed with: CRNA  Anesthesia Plan Comments:         Anesthesia Quick Evaluation

## 2017-05-16 NOTE — Op Note (Addendum)
PROCEDURE DATE:05/16/2017  PREOPERATIVE DIAGNOSIS:  Ruptured ectopic pregnancy causing hemoperitoneum POSTOPERATIVE DIAGNOSIS:  Ruptured left cornual ectopic pregnancy causing hemoperitoneum SURGEON:   Jaynie CollinsUgonna Brycelyn Gambino, M.D. ASSISTANT: Nolene EbbsJulie Degele, M.D. ANESTHESIOLOGIST: Dr. Phillips GroutPeter Carignan OPERATION:  Diagnostic laparoscopy with conversion to Exploratory laparotomy, Left cornual wedge resection and Left salpingectomy with removal of ectopic pregnancy ANESTHESIA:  General endotracheal.  INDICATIONS: The patient is a 22 y.o. G4P0030 with  ruptured left ectopic pregnancy causing hemoperitoneum who needed definitive surgical management. The risks of surgery were discussed with the patient including but not limited to: bleeding which may require transfusion or reoperation; infection which may require antibiotics; injury to bowel, bladder, ureters or other surrounding organs; need for additional procedures including laparotomy; thromboembolic phenomenon, incisional problems and other postoperative/anesthesia complications. Written informed consent was obtained.    OPERATIVE FINDINGS:  Moderate amount of hemoperitoneum estimated to be about 500 ml of blood and clots.  Ruptured large left cornual region with significant active bleeding noted posteriorly. Otherwise, small uterus with normal right fallopian tube and normal bilateral ovaries.   No other anomalies noted in pelvis or upper abdomen.  ESTIMATED BLOOD LOSS: Minimal intraoperative blood loss; 500 ml hemoperitoneum FLUIDS:  2000 ml of Lactated Ringers URINE OUTPUT:  50 ml of clear yellow urine. SPECIMENS:  Left cornual region and fallopian tube containing ectopic gestation sent to pathology COMPLICATIONS:  None immediate.  DESCRIPTION OF PROCEDURE:  The patient received intravenous antibiotics and had sequential compression devices applied to her lower extremities while in MAU.   She was taken to the operating room and placed under general  anesthesia without difficulty.  She was placed in the dorsal lithotomy position, and was prepped and draped in a sterile manner.  A Foley catheter was inserted into her bladder and attached to constant drainage and a uterine manipulator was then advanced into the uterus.    After an adequate timeout was performed, attention was turned to the abdomen where an umbilical incision was made with the scalpel.  The Optiview 11-mm trocar and sleeve were then advanced without difficulty with the laparoscope under direct visualization into the abdomen.  The abdomen was then insufflated with carbon dioxide gas and adequate pneumoperitoneum was obtained.  A survey of the patient's pelvis and abdomen revealed the findings above.  The decision was made to convert to an laparotomy in order to do a wedge cornual resection for the cornual ectopic pregnancy.  The abdomen was desufflated, and all instruments were removed.  The fascial incision of the 11-mm site was reapproximated with a 0 Vicryl figure-of-eight stitch; and the skin incision was closed with a 4-0 Monocryl subcuticular stitch.   After an updated timeout was performed, a small Pfannensteil skin incision was made with the scalpel.  This incision was taken down to the fascia and the fascia was incised in the midline and the fascial incision was then extended bilaterally without difficulty. The fascia was then dissected off the underlying rectus muscles using blunt and sharp dissection. The rectus muscles were split bluntly in the midline and the peritoneum entered bluntly without complication. This peritoneal incision was then extended superiorly and inferiorly with care given to prevent bowel or bladder injury.  There was a moderate amount of hemoperitoneum which was suctioned.  The bowel was then packed away with moist laparotomy sponges.  The bleeding left cornual region was then clamped on both sides using long Kelly clamps, leaving a wedge area that involved the  ectopic gestation, the bleeding area and left fallopian  tube. This wedge region was then resected.  The pedicle was doubly suture ligated with 0 Vicryl on both sides; additional myometrial and serosal layers were added with 0 Vicryl then 2-0 Vicryl figure-of-eight stitches.  Fair hemostasis was noted; there was some mild serosal bleeding.  The pelvis was irrigated and good hemostasis was obtained with application of Surgicel over the operative site.   All laparotomy sponges and instruments were removed from the abdomen. The peritoneum was reapproximated with a  0 Vicryl running stitches. The fascia was also closed in a running fashion with 0 Vicryl suture. The subcutaneous layer was irrigated, injected with 30 ml of 0.5% Marcaine. The skin was closed with a 4-0 Vicryl subcuticular stitch.  Sponge, lap, needle, and instrument counts were correct times three. The patient was taken to the recovery area awake, extubated and in stable condition.   Jaynie Collins, MD, FACOG Obstetrician & Gynecologist, [facility] for [employer], [facility]

## 2017-05-16 NOTE — H&P (Addendum)
OB/GYN Preoperative H&P   History    CSN: [id number]  Arrival date & time 05/16/17  1720      Chief Complaint  Patient presents with  . Abdominal Pain    HPI: Sara Rubio is a 22 y.o. G2P1001 @ 5.6 wks by LMP who presented to MAU with severe low abdominal pain with sudden onset. Also states has HSV outbreak and is presently on Valtrex. No presyncopal symptoms. Denies any abnormal vaginal discharge, fevers, chills, sweats, dysuria, nausea, vomiting, other GI or GU symptoms or other general symptoms.  Past Medical History:  Diagnosis Date  . Genital herpes   . Headache   . Seasonal allergies    Past Surgical History:  Procedure Laterality Date  . WISDOM TOOTH EXTRACTION     No family history on file.  Social History        Tobacco Use  . Smoking status: Former Smoker    Last attempt to quit: 10/18/2012    Years since quitting: 4.5  . Smokeless tobacco: Never Used  Substance Use Topics  . Alcohol use: No  . Drug use: Yes    Types: Marijuana    Comment: last useDec 1 2015    OB History    Gravida Para Term Preterm AB Living   2 1 1     1    SAB TAB Ectopic Multiple Live Births         0 1    Term SVD x 1  Review of Systems  Constitutional: Negative.   HENT: Negative.   Eyes: Negative.   Cardiovascular: Negative.   Gastrointestinal: Positive for abdominal pain.  Endocrine: Negative.   Genitourinary: Positive for genital sores and vaginal pain.  Musculoskeletal: Negative.   Skin: Negative.   Allergic/Immunologic: Negative.   Neurological: Negative.   Hematological: Negative.   Psychiatric/Behavioral: Negative.     Allergies  Patient has no known allergies.  Home Medications   Patient Vitals for the past 24 hrs:  BP Temp Temp src Pulse Resp Height Weight  05/16/17 1741 (!) 115/54 98 F (36.7 C) Oral (!) 104 18 5\' 8"  (1.727 m) 185 lb (83.9 kg)   Physical Exam  Constitutional: She is oriented to person, place, and  time. She appears well-developed and well-nourished.  HENT:  Head: Normocephalic.  Eyes: Pupils are equal, round, and reactive to light.  Neck: Normal range of motion.  Cardiovascular: Normal rate, regular rhythm, normal heart sounds and intact distal pulses.  Pulmonary/Chest: Effort normal and breath sounds normal.  Abdominal: Soft. There is tenderness, some rebound and guarding Genitourinary: Multiple ulcerative lesions on perineum and labia Musculoskeletal: Normal range of motion.  Neurological: She is alert and oriented to person, place, and time. She has normal reflexes.  Skin: Skin is warm and dry.  Psychiatric: She has a normal mood and affect. Her behavior is normal. Judgment and thought content normal.    MAU Course  Procedures (including critical care time)  Labs and Ultrasound ordered: Results for orders placed or performed during the hospital encounter of 05/16/17 (from the past 24 hour(s))  Wet prep, genital     Status: Abnormal   Collection Time: 05/16/17  5:50 PM  Result Value Ref Range   Yeast Wet Prep HPF POC PRESENT (A) NONE SEEN   Trich, Wet Prep NONE SEEN NONE SEEN   Clue Cells Wet Prep HPF POC PRESENT (A) NONE SEEN   WBC, Wet Prep HPF POC FEW (A) NONE SEEN   Sperm NONE SEEN  Pregnancy, urine POC     Status: Abnormal   Collection Time: 05/16/17  6:44 PM  Result Value Ref Range   Preg Test, Ur POSITIVE (A) NEGATIVE  CBC with Differential/Platelet     Status: None (Preliminary result)   Collection Time: 05/16/17  6:55 PM  Result Value Ref Range   WBC 8.4 4.0 - 10.5 K/uL   RBC 4.02 3.87 - 5.11 MIL/uL   Hemoglobin 12.6 12.0 - 15.0 g/dL   HCT 09.8 11.9 - 14.7 %   MCV 90.3 78.0 - 100.0 fL   MCH 31.3 26.0 - 34.0 pg   MCHC 34.7 30.0 - 36.0 g/dL   RDW 82.9 56.2 - 13.0 %   Platelets 246 150 - 400 K/uL   Neutrophils Relative % 72 %   Neutro Abs 6.0 1.7 - 7.7 K/uL   Lymphocytes Relative 25 %   Lymphs Abs 2.1 0.7 - 4.0 K/uL   Monocytes Relative 3 %    Monocytes Absolute 0.3 0.1 - 1.0 K/uL   Eosinophils Relative 0 %   Eosinophils Absolute 0.0 0.0 - 0.7 K/uL   Basophils Relative 0 %   Basophils Absolute 0.0 0.0 - 0.1 K/uL   Other PENDING %   US Ob Comp Less 14 Wks  Result Date: 05/16/2017 CLINICAL DATA:  22 year old female with positive pregnancy test and severe pelvic pain. EXAM: OBSTETRIC <14 WK [place] AND TRANSVAGINAL OB US TECHNIQUE: Both transabdominal and transvaginal ultrasound examinations were performed for complete evaluation of the gestation as well as the maternal uterus, adnexal regions, and pelvic cul-de-sac. Transvaginal technique was performed to assess early pregnancy. The study is limited due to patient extreme pain. COMPARISON:  None. FINDINGS: Intrauterine gestational sac: Not visualized Maternal uterus/adnexae: A 6.8 x 4.1 x 3.8 cm LEFT adnexal mass with probable gestational sac and fetal pole noted. The RIGHT ovary is not well visualized. A small to moderate amount of free fluid within the pelvis noted. IMPRESSION: 6.8 cm LEFT adnexal mass containing probable gestational/fetal pole highly suspicious for LEFT ectopic pregnancy. Small to moderate free fluid. Unremarkable uterus. RIGHT ovary not visualized. Critical Value/emergent results were called by telephone at the time of interpretation on 05/16/2017 at 6:43 pm to [facility] , who verbally acknowledged these results. Electronically Signed   By: Harmon Pier M.D.   On: 05/16/2017 18:43   US Ob Transvaginal  Result Date: 05/16/2017 CLINICAL DATA:  22 year old female with positive pregnancy test and severe pelvic pain. EXAM: OBSTETRIC <14 WK [place] AND TRANSVAGINAL OB US TECHNIQUE: Both transabdominal and transvaginal ultrasound examinations were performed for complete evaluation of the gestation as well as the maternal uterus, adnexal regions, and pelvic cul-de-sac. Transvaginal technique was performed to assess early pregnancy. The study is limited due to patient extreme pain.  COMPARISON:  None. FINDINGS: Intrauterine gestational sac: Not visualized Maternal uterus/adnexae: A 6.8 x 4.1 x 3.8 cm LEFT adnexal mass with probable gestational sac and fetal pole noted. The RIGHT ovary is not well visualized. A small to moderate amount of free fluid within the pelvis noted. IMPRESSION: 6.8 cm LEFT adnexal mass containing probable gestational/fetal pole highly suspicious for LEFT ectopic pregnancy. Small to moderate free fluid. Unremarkable uterus. RIGHT ovary not visualized. Critical Value/emergent results were called by telephone at the time of interpretation on 05/16/2017 at 6:43 pm to [facility] , who verbally acknowledged these results. Electronically Signed   By: Harmon Pier M.D.   On: 05/16/2017 18:43     MDM  VSS, multiple ulcerative  lesions on labia and perineum. abd soft but very tender with exam. Wet prep . Cultures obtained. US shows left ectopic preg . Dr. Macon LargeAnyanwu made aware. Diagnosis discussed with pt and aware and understands.  Zerita Boersarlene Lawson, CNM    Attestation of Attending Supervision of Advanced Practice Provider (PA/CNM/NP): Evaluation and management procedures were performed by the Advanced Practice Provider under my supervision and collaboration.  I have reviewed the Advanced Practice Provider's note and chart, and I agree with the management and plan.   22 y.o. G2P1001 with ruptured left tubal ectopic pregnancy causing hemoperitoneum.   On exam, she has tachycardia but is normotensive, and a surgical abdomen. Patient was counseled regarding this diagnosis and need for laparoscopic unilateral salpingectomy and removal of ectopic pregnancy. Risks of surgery including bleeding which may require transfusion or reoperation, infection, injury to bowel or other surrounding organs, need for additional procedures including laparotomy were explained to patient and written informed consent was obtained.   Patient has been NPO since 1200 and she will remain NPO for  procedure. Anesthesia and OR aware.  Preoperative prophylactic antibiotics and SCDs ordered on call to the OR.  To OR when ready.   Jaynie CollinsUGONNA  Mariadelosang Wynns, MD, FACOG Obstetrician & Gynecologist, [facility] [employer], [facility]

## 2017-05-16 NOTE — MAU Note (Signed)
Pt presents to MAU with c/o lower abdominal cramping and rectal pressure that started today. Pt denies VB.

## 2017-05-17 ENCOUNTER — Other Ambulatory Visit: Payer: Self-pay

## 2017-05-17 ENCOUNTER — Encounter (HOSPITAL_COMMUNITY): Payer: Self-pay | Admitting: Obstetrics & Gynecology

## 2017-05-17 DIAGNOSIS — Z87891 Personal history of nicotine dependence: Secondary | ICD-10-CM | POA: Diagnosis not present

## 2017-05-17 DIAGNOSIS — A6000 Herpesviral infection of urogenital system, unspecified: Secondary | ICD-10-CM | POA: Diagnosis present

## 2017-05-17 DIAGNOSIS — K661 Hemoperitoneum: Secondary | ICD-10-CM | POA: Diagnosis present

## 2017-05-17 DIAGNOSIS — O00102 Left tubal pregnancy without intrauterine pregnancy: Secondary | ICD-10-CM | POA: Diagnosis present

## 2017-05-17 DIAGNOSIS — F419 Anxiety disorder, unspecified: Secondary | ICD-10-CM | POA: Diagnosis present

## 2017-05-17 DIAGNOSIS — Z3A01 Less than 8 weeks gestation of pregnancy: Secondary | ICD-10-CM | POA: Diagnosis not present

## 2017-05-17 DIAGNOSIS — R1030 Lower abdominal pain, unspecified: Secondary | ICD-10-CM | POA: Diagnosis present

## 2017-05-17 HISTORY — DX: Anxiety disorder, unspecified: F41.9

## 2017-05-17 LAB — HIV ANTIBODY (ROUTINE TESTING W REFLEX): HIV SCREEN 4TH GENERATION: NONREACTIVE

## 2017-05-17 LAB — CBC
HEMATOCRIT: 32.1 % — AB (ref 36.0–46.0)
Hemoglobin: 11.2 g/dL — ABNORMAL LOW (ref 12.0–15.0)
MCH: 31.1 pg (ref 26.0–34.0)
MCHC: 34.9 g/dL (ref 30.0–36.0)
MCV: 89.2 fL (ref 78.0–100.0)
Platelets: 212 10*3/uL (ref 150–400)
RBC: 3.6 MIL/uL — ABNORMAL LOW (ref 3.87–5.11)
RDW: 12.2 % (ref 11.5–15.5)
WBC: 9.7 10*3/uL (ref 4.0–10.5)

## 2017-05-17 LAB — COMPREHENSIVE METABOLIC PANEL
ALT: 16 U/L (ref 14–54)
AST: 19 U/L (ref 15–41)
Albumin: 3.6 g/dL (ref 3.5–5.0)
Alkaline Phosphatase: 45 U/L (ref 38–126)
Anion gap: 5 (ref 5–15)
BILIRUBIN TOTAL: 0.5 mg/dL (ref 0.3–1.2)
BUN: 7 mg/dL (ref 6–20)
CO2: 24 mmol/L (ref 22–32)
CREATININE: 0.55 mg/dL (ref 0.44–1.00)
Calcium: 8.6 mg/dL — ABNORMAL LOW (ref 8.9–10.3)
Chloride: 101 mmol/L (ref 101–111)
GFR calc Af Amer: >60 mL/min (ref >60)
Glucose, Bld: 126 mg/dL — ABNORMAL HIGH (ref 65–99)
Potassium: 3.9 mmol/L (ref 3.5–5.1)
Sodium: 130 mmol/L — ABNORMAL LOW (ref 135–145)
TOTAL PROTEIN: 6.5 g/dL (ref 6.5–8.1)

## 2017-05-17 LAB — HEPATITIS C ANTIBODY: HCV Ab: 0.1 s/co ratio (ref 0.0–0.9)

## 2017-05-17 LAB — HEPATITIS B SURFACE ANTIGEN: Hepatitis B Surface Ag: NEGATIVE

## 2017-05-17 LAB — RPR: RPR: NONREACTIVE

## 2017-05-17 MED ORDER — MAGNESIUM CITRATE PO SOLN
1.0000 | Freq: Once | ORAL | Status: DC | PRN
  Filled 2017-05-17: qty 296

## 2017-05-17 MED ORDER — IBUPROFEN 600 MG PO TABS: 600.0000 mg | ORAL_TABLET | Freq: Four times a day (QID) | ORAL | 2 refills | Status: DC | PRN

## 2017-05-17 MED ORDER — ONDANSETRON HCL 4 MG PO TABS: 4.0000 mg | ORAL_TABLET | Freq: Four times a day (QID) | ORAL | 2 refills | Status: DC | PRN

## 2017-05-17 MED ORDER — FLUCONAZOLE 150 MG PO TABS
150.0000 mg | ORAL_TABLET | Freq: Once | ORAL | Status: DC
  Filled 2017-05-17: qty 1

## 2017-05-17 MED ORDER — BISACODYL 10 MG RE SUPP: 10.0000 mg | Freq: Every day | RECTAL | Status: DC | PRN

## 2017-05-17 MED ORDER — ONDANSETRON HCL 4 MG/2ML IJ SOLN
4.0000 mg | Freq: Four times a day (QID) | INTRAMUSCULAR | Status: DC | PRN
  Administered 2017-05-17: 4 mg via INTRAVENOUS
  Filled 2017-05-17: qty 2

## 2017-05-17 MED ORDER — SIMETHICONE 80 MG PO CHEW: 80.0000 mg | CHEWABLE_TABLET | Freq: Four times a day (QID) | ORAL | Status: DC | PRN

## 2017-05-17 MED ORDER — METRONIDAZOLE 500 MG PO TABS: 500.0000 mg | ORAL_TABLET | Freq: Two times a day (BID) | ORAL | 0 refills | Status: DC

## 2017-05-17 MED ORDER — VALACYCLOVIR HCL 500 MG PO TABS: 500.0000 mg | ORAL_TABLET | Freq: Two times a day (BID) | ORAL | 2 refills | Status: DC

## 2017-05-17 MED ORDER — LACTATED RINGERS IV SOLN
INTRAVENOUS | Status: DC
  Administered 2017-05-17: via INTRAVENOUS

## 2017-05-17 MED ORDER — DOCUSATE SODIUM 100 MG PO CAPS: 100.0000 mg | ORAL_CAPSULE | Freq: Two times a day (BID) | ORAL | 2 refills | Status: DC

## 2017-05-17 MED ORDER — ZOLPIDEM TARTRATE 5 MG PO TABS: 5.0000 mg | ORAL_TABLET | Freq: Every evening | ORAL | Status: DC | PRN

## 2017-05-17 MED ORDER — LORAZEPAM 1 MG PO TABS: 1.0000 mg | ORAL_TABLET | Freq: Three times a day (TID) | ORAL | 0 refills | Status: DC | PRN

## 2017-05-17 MED ORDER — METRONIDAZOLE 500 MG PO TABS: 500.0000 mg | ORAL_TABLET | Freq: Two times a day (BID) | ORAL | Status: DC

## 2017-05-17 MED ORDER — ONDANSETRON HCL 4 MG PO TABS: 4.0000 mg | ORAL_TABLET | Freq: Four times a day (QID) | ORAL | Status: DC | PRN

## 2017-05-17 MED ORDER — PANTOPRAZOLE SODIUM 40 MG PO TBEC: 40.0000 mg | DELAYED_RELEASE_TABLET | Freq: Every day | ORAL | Status: DC

## 2017-05-17 MED ORDER — ENOXAPARIN SODIUM 40 MG/0.4ML ~~LOC~~ SOLN
40.0000 mg | SUBCUTANEOUS | Status: DC
  Filled 2017-05-17: qty 0.4

## 2017-05-17 MED ORDER — LORAZEPAM 1 MG PO TABS: 1.0000 mg | ORAL_TABLET | Freq: Four times a day (QID) | ORAL | Status: DC | PRN

## 2017-05-17 MED ORDER — OXYCODONE-ACETAMINOPHEN 5-325 MG PO TABS: 1.0000 | ORAL_TABLET | Freq: Four times a day (QID) | ORAL | 0 refills | Status: DC | PRN

## 2017-05-17 MED ORDER — OXYCODONE-ACETAMINOPHEN 5-325 MG PO TABS
1.0000 | ORAL_TABLET | Freq: Four times a day (QID) | ORAL | Status: DC | PRN
  Administered 2017-05-17 (×2): 1 via ORAL
  Filled 2017-05-17: qty 1

## 2017-05-17 MED ORDER — SENNOSIDES-DOCUSATE SODIUM 8.6-50 MG PO TABS: 1.0000 | ORAL_TABLET | Freq: Every evening | ORAL | Status: DC | PRN

## 2017-05-17 MED ORDER — HYDROMORPHONE HCL 1 MG/ML IJ SOLN
1.0000 mg | INTRAMUSCULAR | Status: DC | PRN
  Administered 2017-05-17: 1 mg via INTRAVENOUS
  Filled 2017-05-17: qty 1

## 2017-05-17 MED ORDER — KETOROLAC TROMETHAMINE 30 MG/ML IJ SOLN
30.0000 mg | Freq: Four times a day (QID) | INTRAMUSCULAR | Status: DC
  Administered 2017-05-17: 30 mg via INTRAVENOUS
  Filled 2017-05-17: qty 1

## 2017-05-17 MED ORDER — VALACYCLOVIR HCL 500 MG PO TABS
500.0000 mg | ORAL_TABLET | Freq: Two times a day (BID) | ORAL | Status: DC
  Filled 2017-05-17 (×3): qty 1

## 2017-05-17 MED ORDER — DOCUSATE SODIUM 100 MG PO CAPS
100.0000 mg | ORAL_CAPSULE | Freq: Two times a day (BID) | ORAL | Status: DC
  Administered 2017-05-17: 100 mg via ORAL
  Filled 2017-05-17: qty 1

## 2017-05-17 MED ORDER — IBUPROFEN 600 MG PO TABS: 600.0000 mg | ORAL_TABLET | Freq: Four times a day (QID) | ORAL | Status: DC | PRN

## 2017-05-17 MED ORDER — MENTHOL 3 MG MT LOZG
1.0000 | LOZENGE | OROMUCOSAL | Status: DC | PRN
  Filled 2017-05-17: qty 9

## 2017-05-17 MED ORDER — ALUM & MAG HYDROXIDE-SIMETH 200-200-20 MG/5ML PO SUSP: 30.0000 mL | ORAL | Status: DC | PRN

## 2017-05-17 NOTE — Discharge Summary (Signed)
Gynecology Physician Postoperative Discharge Summary  Patient ID: Sara Rubio MRN: [id number] DOB/AGE: 1995/07/02 22 y.o.  Admit Date: 05/16/2017 Discharge Date: 05/17/2017  Operative Diagnoses: Ruptured left cornual ectopic pregnancy causing hemoperitoneum  Procedures:  LAPAROSCOPY DIAGNOSTIC  EXPLORATORY LAPAROTOMY, LEFT CORNUAL WEDGE RESECTION LEFT SALPINGECTOMY   Hospital Course:  Sara DenseBritney R Mcneil is a 22 y.o. G2P1001 admitted for urgent surgery for ruptured ectopic pregnancy.  She underwent the procedures as mentioned above, was found to have a ruptured left cornual ectopic pregnancy and underwent an open cornual wedge resection.  Her operation was uncomplicated. For further details about surgery, please refer to the operative report. She was very anxious, and was given Ativan to help postoperatively.  She was also treated for ongoing HSV outbreak, yeast and bacterial vaginitis. Otherwise, she had an uncomplicated postoperative course.  By time of discharge on the evening of POD#1, her pain was controlled on oral pain medications; she was ambulating, voiding without difficulty, tolerating regular diet and passing flatus.  She was deemed stable for discharge to home with close outpatient follow up.   Of note, the surgery details were discussed extensively with patient and her family.  Emphasized need for weekly HCG draws until <5.  Also discussed need for cesarean deliveries at 37 weeks for any subsequent pregnancies; labor is absolutely contraindicated. Patient and her family verbalized understanding of these recommendations.  Significant Labs: CBC Latest Ref Rng & Units 05/17/2017 05/16/2017 10/21/2014  WBC 4.0 - 10.5 K/uL 9.7 8.4 16.2(H)  Hemoglobin 12.0 - 15.0 g/dL 11.2(L) 12.6 9.1(L)  Hematocrit 36.0 - 46.0 % 32.1(L) 36.3 27.6(L)  Platelets 150 - 400 K/uL 212 246 218    Discharge Exam: Blood pressure (!) 103/47, pulse 66, temperature 97.9 F (36.6 C), temperature source Oral,  resp. rate 16, height 5\' 8"  (1.727 m), weight 185 lb (83.9 kg), last menstrual period 04/05/2017, SpO2 97 %. General appearance: alert and no distress  Resp: clear to auscultation bilaterally  Cardio: regular rate and rhythm  GI: soft, non-tender; bowel sounds normal; no masses, no organomegaly.  Incision: C/D/I, no erythema, no drainage noted Pelvic: scant blood on pad  Extremities: extremities normal, atraumatic, no cyanosis or edema and Homans sign is negative, no sign of DVT  Discharged Condition: Stable  Disposition: 01-Home or Self Care   Allergies as of 05/17/2017   No Known Allergies     Medication List    TAKE these medications   docusate sodium 100 MG capsule Commonly known as:  COLACE Take 1 capsule (100 mg total) by mouth 2 (two) times daily.   ibuprofen 600 MG tablet Commonly known as:  ADVIL,MOTRIN Take 1 tablet (600 mg total) by mouth every 6 (six) hours as needed (mild pain).   LORazepam 1 MG tablet Commonly known as:  ATIVAN Take 1 tablet (1 mg total) by mouth every 8 (eight) hours as needed for anxiety or sleep.   metroNIDAZOLE 500 MG tablet Commonly known as:  FLAGYL Take 1 tablet (500 mg total) by mouth every 12 (twelve) hours.   ondansetron 4 MG tablet Commonly known as:  ZOFRAN Take 1 tablet (4 mg total) by mouth every 6 (six) hours as needed for nausea.   oxyCODONE-acetaminophen 5-325 MG tablet Commonly known as:  PERCOCET/ROXICET Take 1-2 tablets by mouth every 6 (six) hours as needed for moderate pain or severe pain.   oxymetazoline 0.05 % nasal spray Commonly known as:  AFRIN Place 1 spray into both nostrils 2 (two) times daily as needed for  congestion.   valACYclovir 500 MG tablet Commonly known as:  VALTREX TAKE 1 TABLET BY MOUTH ONCE DAILY X 7 DAYS What changed:  Another medication with the same name was added. Make sure you understand how and when to take each.   valACYclovir 500 MG tablet Commonly known as:  VALTREX Take 1 tablet  (500 mg total) by mouth 2 (two) times daily. What changed:  You were already taking a medication with the same name, and this prescription was added. Make sure you understand how and when to take each.      Follow-up Information    [facility] Follow up on 05/25/2017.   Specialty:  Obstetrics and Gynecology Why:  Lab draw. You will be called with the date and time of this and other follow up appointments. Call them if you have not heard from them by early next week.  Contact information: [address] [place] [address] [phone]          Signed:  Jaynie Collins, MD, FACOG Attending Obstetrician & Gynecologist Faculty Practice, [facility]

## 2017-05-17 NOTE — Progress Notes (Addendum)
1 Day Post-Op Procedure(s) (LRB): LAPAROSCOPY DIAGNOSTIC (N/A) EXPLORATORY LAPAROTOMY, LEFT CORNUAL WEDGE RESECTION UNILATERAL SALPINGECTOMY (Left) RUPTURED CORNUAL ECTOPIC PREGNANCY (Left)  Subjective: Patient reports incisional pain, tolerating PO, + flatus and no problems voiding.  Really desires to go home later today.     Objective: I have reviewed patient's vital signs, intake and output, medications and labs. Patient Vitals for the past 24 hrs:  BP Temp Temp src Pulse Resp SpO2 Height Weight  05/17/17 0300 128/68 98 F (36.7 C) Oral 84 16 97 % - -  05/17/17 0230 - - - - - 97 % - -  05/17/17 0200 136/82 98 F (36.7 C) Oral (!) 107 16 97 % - -  05/17/17 0130 - - - - - 96 % - -  05/17/17 0101 124/72 97.9 F (36.6 C) Oral (!) 102 16 96 % - -  05/17/17 0000 (!) 141/72 97.8 F (36.6 C) Oral 96 17 96 % - -  05/16/17 2330 133/76 98.4 F (36.9 C) Oral 84 16 99 % - -  05/16/17 2315 (!) 152/80 97.9 F (36.6 C) - 87 17 99 % - -  05/16/17 2300 (!) 148/77 - - 89 15 100 % - -  05/16/17 2245 136/75 - - 88 12 99 % - -  05/16/17 2230 (!) 147/80 - - 98 12 99 % - -  05/16/17 2215 (!) 143/81 - - (!) 105 12 99 % - -  05/16/17 2209 (!) 157/48 (!) 97 F (36.1 C) - (!) 110 12 100 % - -  05/16/17 1741 (!) 115/54 98 F (36.7 C) Oral (!) 104 18 - 5\' 8"  (1.727 m) 185 lb (83.9 kg)    Intake/Output Summary (Last 24 hours) at 05/17/2017 [id number] Last data filed at 05/17/2017 0600 Gross per 24 hour  Intake 2000 ml  Output 2350 ml  Net -350 ml   General: alert and no distress Resp: clear to auscultation bilaterally Cardio: regular rate and rhythm GI: soft, non-tender; bowel sounds normal; no masses,  no organomegaly and incision: clean, dry, intact and old bloody drainage present Extremities: extremities normal, atraumatic, no cyanosis or edema, Homans sign is negative, no sign of DVT and no edema, redness or tenderness in the calves or thighs Vaginal Bleeding: minimal  CBC Latest Ref Rng & Units  05/17/2017 05/16/2017 10/21/2014  WBC 4.0 - 10.5 K/uL 9.7 8.4 16.2(H)  Hemoglobin 12.0 - 15.0 g/dL 11.2(L) 12.6 9.1(L)  Hematocrit 36.0 - 46.0 % 32.1(L) 36.3 27.6(L)  Platelets 150 - 400 K/uL 212 246 218    Assessment: s/p Procedure(s): LAPAROSCOPY DIAGNOSTIC (N/A) EXPLORATORY LAPAROTOMY, LEFT CORNUAL WEDGE RESECTION UNILATERAL SALPINGECTOMY (Left) RUPTURED CORNUAL ECTOPIC PREGNANCY (Left): stable and progressing well  Plan: Discontinue IV fluids and foley catheter Advance diet as tolerated Encourage ambulation Advance to PO medication, analgesia as needed Continue ongoing treatments for HSV outbreak, yeast vaginitis and bacterial vaginitis.  Routine postoperative care Plan for discharge to home later today or tomorrow morning; discharge process done.   Jaynie CollinsUgonna Alyanna Stoermer, MD 05/17/2017, 6:53 AM

## 2017-05-17 NOTE — Progress Notes (Signed)
Discharge instructions reviewed with patient.  Patient states understanding of home care, medications, activity, signs/symptoms to report to MD and return MD office visit.  Patients significant other and family will assist with her care @ home.  No home  equipment needed, patient has prescriptions and all personal belongings.  Patient discharged via wheelchair in stable condition with staff without incident.  

## 2017-05-17 NOTE — Anesthesia Postprocedure Evaluation (Signed)
Anesthesia Post Note  Patient: Sara Rubio  Procedure(s) Performed: LAPAROSCOPY DIAGNOSTIC (N/A Abdomen) EXPLORATORY LAPAROTOMY, LEFT CORNUAL WEDGE RESECTION ECTOPIC PREGNANCY (N/A Abdomen) UNILATERAL SALPINGECTOMY (Left Abdomen)     Patient location during evaluation: [facility] Anesthesia Type: General Level of consciousness: awake and alert Pain management: pain level controlled Vital Signs Assessment: post-procedure vital signs reviewed and stable Respiratory status: spontaneous breathing Cardiovascular status: blood pressure returned to baseline Anesthetic complications: no Comments: Pt report pain score of 7.  Received pain med at 0430 per RN.  Pt has heating pad on.  RN notified of pt's pain score.    Last Vitals:  Vitals:   05/17/17 0300 05/17/17 0659  BP: 128/68 (!) 107/44  Pulse: 84 64  Resp: 16 16  Temp: 36.7 C 36.6 C  SpO2: 97% 97%    Last Pain:  Vitals:   05/17/17 0659  TempSrc: Oral  PainSc:    Pain Goal: Patients Stated Pain Goal: 3 (05/17/17 0532)               Merrilyn PumaWRINKLE,Juliette Standre

## 2017-05-17 NOTE — Discharge Instructions (Signed)
You will need follow up weekly blood draws to ensure pregnancy hormone levels are going down  You will need cesarean sections three weeks before your due date for any following pregnancies  [facility].  They will call you with the date and time of this appointment and other follow up appointments. Call them at [phone] if you have not heard from them by early next week.   Exploratory Laparotomy, Adult, Care After Refer to this sheet in the next few weeks. These instructions provide you with information about caring for yourself after your procedure. Your health care provider may also give you more specific instructions. Your treatment has been planned according to current medical practices, but problems sometimes occur. Call your health care provider if you have any problems or questions after your procedure. What can I expect after the procedure? After your procedure, it is typical to have:  Abdominal soreness.  Fatigue.  A sore throat from tubes in your throat.  A lack of appetite.  Follow these instructions at home: Medicines  Take medicines only as directed by your health care provider.  Do not drive or operate heavy machinery while taking pain medicine. Incision care  There are many different ways to close and cover an incision, including stitches (sutures), skin glue, and adhesive strips. Follow your health care provider's instructions about: ? Incision care. ? Bandage (dressing) changes and removal. ? Incision closure removal.  Do not take showers or baths until your health care provider says that you can.  Check your incision area daily for signs of infection. Watch for: ? Redness. ? Tenderness. ? Swelling. ? Drainage. Activity  Do not lift anything that is heavier than 15 pounds until your health care provider says that it is safe.  Try to walk a little bit each day if your health care provider  says that it is okay.  Ask your health care provider when you can start to do your usual activities again, such as driving, going back to work, and having sex. Eating and drinking  You may eat what you usually eat. Include lots of whole grains, fruits, and vegetables in your diet. This will help to prevent constipation.  Drink enough fluid to keep your urine clear or pale yellow. General instructions  Keep all follow-up visits as directed by your health care provider. This is important. Contact a health care provider if:  You have a fever.  You have chills.  Your pain medicine is not helping.  You have constipation or diarrhea.  You have nausea or vomiting.  You have drainage, redness, swelling, or pain at your incision site. Get help right away if:  Your pain is getting worse.  It has been more than 3 days since you been able to have a bowel movement.  You have ongoing (persistent) vomiting.  The edges of your incision open up.  You have warmth, tenderness, and swelling in your calf.  You have trouble breathing.  You have chest pain. This information is not intended to replace advice given to you by your health care provider. Make sure you discuss any questions you have with your health care provider. Document Released: 10/23/2003 Document Revised: 08/16/2015 Document Reviewed: 10/26/2013 Elsevier Interactive Patient Education  2018 [employer]   Ruptured Ectopic Pregnancy An ectopic pregnancy is when the fertilized egg attaches (implants) outside the uterus. Most ectopic pregnancies occur in the fallopian tube. Rarely do ectopic pregnancies occur in  the cornual region of the uterus, on the ovary, intestine, pelvis, or cervix. An ectopic pregnancy does not have the ability to develop into a normal, healthy baby. A ruptured ectopic pregnancy is one in which the cornual region or fallopian tube gets torn or bursts and results in internal bleeding. Often there is  intense abdominal pain, and sometimes, vaginal bleeding. Having an ectopic pregnancy can be a life-threatening experience. If left untreated, this dangerous condition can lead to a blood transfusion, abdominal surgery, or even death. What are the causes? Damage to the cornual area and  fallopian tubes is the suspected cause in most ectopic pregnancies. What increases the risk? Depending on your circumstances, the amount of risk of having an ectopic pregnancy will vary. There are 3 categories that may help you identify whether you are potentially at risk. High Risk  You have gone through infertility treatment.  You have had a previous ectopic pregnancy.  You have had previous tubal surgery.  You have had previous surgery to have the fallopian tubes tied (tubal ligation).  You have tubal problems or diseases.  You have been exposed to DES. DES is a medicine that was used until 1971 and had effects on babies whose mothers took the medicine.  You become pregnant while using an intrauterine device (IUD) for birth control.  Moderate Risk  You have a history of infertility.  You have a history of a sexually transmitted infection (STI).  You have a history of pelvic inflammatory disease (PID).  You have scarring from endometriosis.  You have multiple sexual partners.  You smoke.  Low Risk  You have had previous pelvic surgery.  You use vaginal douching.  You became sexually active before 22 years of age.  What are the signs or symptoms? An ectopic pregnancy should be suspected in anyone who has missed a period and has abdominal pain or bleeding.  You may experience normal pregnancy symptoms, such as: ? Nausea. ? Tiredness. ? Breast tenderness.  Symptoms that are not normal include: ? Pain with intercourse. ? Irregular vaginal bleeding or spotting. ? Cramping or pain on one side, or in the lower abdomen. ? Fast heartbeat. ? Passing out while having a bowel  movement.  Symptoms of a ruptured ectopic pregnancy and internal bleeding may include: ? Sudden, severe pain in the abdomen and pelvis. ? Dizziness or fainting. ? Pain in the shoulder area.  How is this diagnosed? Tests that may be performed include:  A pregnancy test.  An ultrasound.  Testing the specific level of pregnancy hormone in the bloodstream.  Taking a sample of uterus tissue (dilation and curettage, D&C).  Surgery to perform a visual exam of the inside of the abdomen using a lighted tube (laparoscopy).  How is this treated? Laparoscopic surgery or abdominal surgery is recommended for a ruptured ectopic pregnancy.  The whole fallopian tube may need to be removed (salpingectomy). In your case, the left cornual region was removed.  If you have lost a lot of blood, you may need a blood transfusion.  You may receive a Rho (D) immune globulin shot if you are Rh negative and the father is Rh positive, or if you do not know the Rh type of the father. This is to prevent problems with any future pregnancy.  You will need follow up weekly blood draws to ensure pregnancy hormone levels are going down  You will need cesarean sections three weeks before your due date for any following pregnancies.  Get help  right away if: You have any symptoms of an ectopic or ruptured ectopic pregnancy. This is a medical emergency. This information is not intended to replace advice given to you by your health care provider. Make sure you discuss any questions you have with your health care provider. Document Released: 03/07/2000 Document Revised: 09/28/2015 Document Reviewed: 12/20/2012 Elsevier Interactive Patient Education  [employer].

## 2017-05-18 LAB — GC/CHLAMYDIA PROBE AMP (~~LOC~~) NOT AT ARMC
Chlamydia: NEGATIVE
Neisseria Gonorrhea: NEGATIVE

## 2017-05-19 LAB — TYPE AND SCREEN
ABO/RH(D): B POS
ANTIBODY SCREEN: NEGATIVE
UNIT DIVISION: 0
Unit division: 0
Unit division: 0

## 2017-05-19 LAB — BPAM RBC
Blood Product Expiration Date: 201903112359
Blood Product Expiration Date: 201903122359
Blood Product Expiration Date: 201903122359
ISSUE DATE / TIME: 201902260956
ISSUE DATE / TIME: 201902260956
UNIT TYPE AND RH: 5100
Unit Type and Rh: 5100
Unit Type and Rh: 5100

## 2017-05-20 ENCOUNTER — Telehealth: Payer: Self-pay | Admitting: General Practice

## 2017-05-20 DIAGNOSIS — O008 Other ectopic pregnancy without intrauterine pregnancy: Secondary | ICD-10-CM

## 2017-05-20 NOTE — Telephone Encounter (Signed)
Patient called and left message on nurse line stating she is calling about her follow up. Patient states she was told to follow up on the 4th but her appt is on the 22nd. Patient states she had ectopic pregnancy surgery. Called patient & discussed she does have a post op appt on the 22nd but she does also need a blood draw appt on the 4th like she suggested. Appts confirmed with patient for 3/4 & 3/13 @ 930 for bhcg with planned follow up on the 22nd. Patient verbalized understanding to all & had no questions

## 2017-05-20 NOTE — Telephone Encounter (Signed)
Patient called and left message on nurse line stating she has a question about her surgery. Called patient and she states she is having an occasional throbbing sensation and she feels like she feels movement. Discussed with patient she is recovering from surgery and as the nerves/tissues are healing she can feel numbness/pain. Patient verbalized understanding and also reports bloating & fullness like trapped gas. Recommended gas x and walking around. Patient verbalized understanding & reports irritation in her throat from surgery. Patient states she sees a weird clear thing in the back of her throat. Told patient I am uncertain what she is referring to but recommended hot tea, throat spray and cough drops for the irritation. Patient verbalized understanding & had no questions

## 2017-05-21 ENCOUNTER — Inpatient Hospital Stay (HOSPITAL_COMMUNITY)
Admission: AD | Admit: 2017-05-21 | Discharge: 2017-05-21 | Disposition: A | Discharge status: Home / Self Care | Payer: Medicaid Other | Source: Ambulatory Visit | Attending: Obstetrics & Gynecology | Admitting: Obstetrics & Gynecology

## 2017-05-21 ENCOUNTER — Encounter (HOSPITAL_COMMUNITY): Payer: Self-pay | Admitting: *Deleted

## 2017-05-21 ENCOUNTER — Inpatient Hospital Stay (HOSPITAL_COMMUNITY): Payer: Medicaid Other

## 2017-05-21 DIAGNOSIS — R109 Unspecified abdominal pain: Secondary | ICD-10-CM | POA: Diagnosis present

## 2017-05-21 DIAGNOSIS — F419 Anxiety disorder, unspecified: Secondary | ICD-10-CM | POA: Insufficient documentation

## 2017-05-21 DIAGNOSIS — Z87891 Personal history of nicotine dependence: Secondary | ICD-10-CM | POA: Insufficient documentation

## 2017-05-21 DIAGNOSIS — R102 Pelvic and perineal pain: Secondary | ICD-10-CM | POA: Diagnosis not present

## 2017-05-21 DIAGNOSIS — Z9889 Other specified postprocedural states: Secondary | ICD-10-CM | POA: Diagnosis present

## 2017-05-21 DIAGNOSIS — K661 Hemoperitoneum: Secondary | ICD-10-CM | POA: Diagnosis not present

## 2017-05-21 DIAGNOSIS — G8918 Other acute postprocedural pain: Secondary | ICD-10-CM | POA: Insufficient documentation

## 2017-05-21 LAB — BASIC METABOLIC PANEL
ANION GAP: 7 (ref 5–15)
BUN: 10 mg/dL (ref 6–20)
CALCIUM: 9.2 mg/dL (ref 8.9–10.3)
CO2: 24 mmol/L (ref 22–32)
Chloride: 105 mmol/L (ref 101–111)
Creatinine, Ser: 0.72 mg/dL (ref 0.44–1.00)
Glucose, Bld: 96 mg/dL (ref 65–99)
POTASSIUM: 3.8 mmol/L (ref 3.5–5.1)
Sodium: 136 mmol/L (ref 135–145)

## 2017-05-21 LAB — CBC
HEMATOCRIT: 33.6 % — AB (ref 36.0–46.0)
Hemoglobin: 11.7 g/dL — ABNORMAL LOW (ref 12.0–15.0)
MCH: 30.9 pg (ref 26.0–34.0)
MCHC: 34.8 g/dL (ref 30.0–36.0)
MCV: 88.7 fL (ref 78.0–100.0)
PLATELETS: 271 10*3/uL (ref 150–400)
RBC: 3.79 MIL/uL — AB (ref 3.87–5.11)
RDW: 12.4 % (ref 11.5–15.5)
WBC: 5.2 10*3/uL (ref 4.0–10.5)

## 2017-05-21 LAB — URINALYSIS, ROUTINE W REFLEX MICROSCOPIC
BACTERIA UA: NONE SEEN
BILIRUBIN URINE: NEGATIVE
Glucose, UA: NEGATIVE mg/dL
Ketones, ur: NEGATIVE mg/dL
NITRITE: NEGATIVE
PROTEIN: NEGATIVE mg/dL
SPECIFIC GRAVITY, URINE: 1.014 (ref 1.005–1.030)
pH: 9 — ABNORMAL HIGH (ref 5.0–8.0)

## 2017-05-21 MED ORDER — IOPAMIDOL (ISOVUE-300) INJECTION 61%
30.0000 mL | Freq: Once | INTRAVENOUS | Status: AC | PRN
  Administered 2017-05-21: 30 mL via ORAL

## 2017-05-21 MED ORDER — OXYCODONE-ACETAMINOPHEN 5-325 MG PO TABS
1.0000 | ORAL_TABLET | Freq: Once | ORAL | Status: DC
  Filled 2017-05-21: qty 1

## 2017-05-21 MED ORDER — IOPAMIDOL (ISOVUE-300) INJECTION 61%
100.0000 mL | Freq: Once | INTRAVENOUS | Status: AC | PRN
  Administered 2017-05-21: 100 mL via INTRAVENOUS

## 2017-05-21 NOTE — MAU Provider Note (Signed)
History     CSN: [id number]  Arrival date and time: 05/21/17 4098   First Provider Initiated Contact with Patient 05/21/17 1016      Chief Complaint  Patient presents with  . Abdominal Pain   HPI Sara Rubio 22 y.o.  Comes to MAU today with increasing pain after left cornual ectopic pregnancy surgery on 05-16-17.  Was coughing last night and felt something pull in her lower abdomen.  Since then she has been hurting more and is very worried something is wrong.  Has noticed and increase in blood on her abdominal bandage.  Has an appointment in the clinic on Monday for follow up.  Took 1/2 tablet of percocet today before coming in.  Is very worried that something is wrong.  Is tearful as she was wanting to be back to normal now.  Is not able to keep her 22 year old child with her.  OB History    Gravida Para Term Preterm AB Living   2 1 1   1 1    SAB TAB Ectopic Multiple Live Births       1 0 1      Obstetric Comments   Pregnancy #2: 8 week viable cornual ruptured ectopic pregnancy with ~500 ml of hemoperitoneum -> wedge resection Needs cesarean deliveries at 37 weeks for any subsequent pregnancies; labor is absolutely contraindicated.      Past Medical History:  Diagnosis Date  . Anxiety 05/17/2017  . Genital herpes   . Headache   . Left cornual ectopic pregnancy 05/16/2017   8 week viable cornual ruptured ectopic pregnancy with ~500 ml of hemoperitoneum -> wedge resection Needs cesarean deliveries at 37 weeks for any subsequent pregnancies; labor is absolutely contraindicated.  . s/p ELAP, left cornual wedge resection & salpingectomy for ruptured cornual ectopic pregnancy on 05/16/17 05/16/2017  . Seasonal allergies     Past Surgical History:  Procedure Laterality Date  . LAPAROSCOPY N/A 05/16/2017   Procedure: LAPAROSCOPY DIAGNOSTIC;  Surgeon: Tereso Newcomer, MD;  Location: [facility];  Service: Gynecology;  Laterality: N/A;  . LAPAROTOMY N/A 05/16/2017   Procedure:  EXPLORATORY LAPAROTOMY, LEFT CORNUAL WEDGE RESECTION ECTOPIC PREGNANCY;  Surgeon: Tereso Newcomer, MD;  Location: [facility];  Service: Gynecology;  Laterality: N/A;  . UNILATERAL SALPINGECTOMY Left 05/16/2017   Procedure: UNILATERAL SALPINGECTOMY;  Surgeon: Tereso Newcomer, MD;  Location: [facility];  Service: Gynecology;  Laterality: Left;  . WISDOM TOOTH EXTRACTION      History reviewed. No pertinent family history.  Social History   Tobacco Use  . Smoking status: Former Smoker    Last attempt to quit: 10/18/2012    Years since quitting: 4.5  . Smokeless tobacco: Never Used  Substance Use Topics  . Alcohol use: No  . Drug use: Yes    Types: Marijuana    Comment: last useDec 1 2015    Allergies: No Known Allergies  Medications Prior to Admission  Medication Sig Dispense Refill Last Dose  . docusate sodium (COLACE) 100 MG capsule Take 1 capsule (100 mg total) by mouth 2 (two) times daily. 30 capsule 2 05/21/2017 at Unknown time  . ibuprofen (ADVIL,MOTRIN) 600 MG tablet Take 1 tablet (600 mg total) by mouth every 6 (six) hours as needed (mild pain). 60 tablet 2 05/20/2017 at Unknown time  . LORazepam (ATIVAN) 1 MG tablet Take 1 tablet (1 mg total) by mouth every 8 (eight) hours as needed for anxiety or sleep. 20 tablet 0 05/20/2017 at  Unknown time  . metroNIDAZOLE (FLAGYL) 500 MG tablet Take 1 tablet (500 mg total) by mouth every 12 (twelve) hours. 14 tablet 0 05/21/2017 at Unknown time  . ondansetron (ZOFRAN) 4 MG tablet Take 1 tablet (4 mg total) by mouth every 6 (six) hours as needed for nausea. 20 tablet 2 Past Week at Unknown time  . oxyCODONE-acetaminophen (PERCOCET/ROXICET) 5-325 MG tablet Take 1-2 tablets by mouth every 6 (six) hours as needed for moderate pain or severe pain. 30 tablet 0 05/21/2017 at Unknown time  . oxymetazoline (AFRIN) 0.05 % nasal spray Place 1 spray into both nostrils 2 (two) times daily as needed for congestion.   05/20/2017 at Unknown time  . valACYclovir  (VALTREX) 500 MG tablet TAKE 1 TABLET BY MOUTH ONCE DAILY  3 05/21/2017 at Unknown time  . valACYclovir (VALTREX) 500 MG tablet Take 1 tablet (500 mg total) by mouth 2 (two) times daily. 10 tablet 2     Review of Systems  Constitutional: Positive for chills. Negative for fever.  Gastrointestinal: Positive for abdominal pain. Negative for diarrhea, nausea and vomiting.  Genitourinary: Negative for dysuria.   Physical Exam   Blood pressure 137/71, pulse 88, temperature 98.9 F (37.2 C), resp. rate 18, height 5\' 8"  (1.727 m), weight 181 lb (82.1 kg), unknown if currently breastfeeding.  Physical Exam  Nursing note and vitals reviewed. Constitutional: She is oriented to person, place, and time. She appears well-developed and well-nourished.  Tearful with exam.  HENT:  Head: Normocephalic.  Eyes: EOM are normal.  Neck: Neck supple.  Cardiovascular: Normal rate.  Respiratory: Effort normal.  GI: Soft. Bowel sounds are normal. There is tenderness. There is guarding.  Unable to evaluate abdomen well as client is unable to tolerate any palpation to lower abdomen.  Dressings are dry.  There is increased bleeding noted on lower dressing.  Musculoskeletal: Normal range of motion.  Neurological: She is alert and oriented to person, place, and time.  Skin: Skin is warm and dry.  Psychiatric: She has a normal mood and affect.    MAU Course  Procedures  Results for orders placed or performed during the hospital encounter of 05/21/17 (from the past 24 hour(s))  Urinalysis, Routine w reflex microscopic     Status: Abnormal   Collection Time: 05/21/17  9:48 AM  Result Value Ref Range   Color, Urine YELLOW YELLOW   APPearance CLEAR CLEAR   Specific Gravity, Urine 1.014 1.005 - 1.030   pH 9.0 (H) 5.0 - 8.0   Glucose, UA NEGATIVE NEGATIVE mg/dL   Hgb urine dipstick MODERATE (A) NEGATIVE   Bilirubin Urine NEGATIVE NEGATIVE   Ketones, ur NEGATIVE NEGATIVE mg/dL   Protein, ur NEGATIVE NEGATIVE  mg/dL   Nitrite NEGATIVE NEGATIVE   Leukocytes, UA TRACE (A) NEGATIVE   RBC / HPF 0-5 0 - 5 RBC/hpf   WBC, UA 0-5 0 - 5 WBC/hpf   Bacteria, UA NONE SEEN NONE SEEN   Squamous Epithelial / LPF 0-5 (A) NONE SEEN   Mucus PRESENT   CBC     Status: Abnormal   Collection Time: 05/21/17 10:55 AM  Result Value Ref Range   WBC 5.2 4.0 - 10.5 K/uL   RBC 3.79 (L) 3.87 - 5.11 MIL/uL   Hemoglobin 11.7 (L) 12.0 - 15.0 g/dL   HCT 62.9 (L) 52.8 - 41.3 %   MCV 88.7 78.0 - 100.0 fL   MCH 30.9 26.0 - 34.0 pg   MCHC 34.8 30.0 - 36.0 g/dL  RDW 12.4 11.5 - 15.5 %   Platelets 271 150 - 400 K/uL  Basic metabolic panel     Status: None   Collection Time: 05/21/17 10:55 AM  Result Value Ref Range   Sodium 136 135 - 145 mmol/L   Potassium 3.8 3.5 - 5.1 mmol/L   Chloride 105 101 - 111 mmol/L   CO2 24 22 - 32 mmol/L   Glucose, Bld 96 65 - 99 mg/dL   BUN 10 6 - 20 mg/dL   Creatinine, Ser 5.40 0.44 - 1.00 mg/dL   Calcium 9.2 8.9 - 98.1 mg/dL   GFR calc non Af Amer >60 >60 mL/min   GFR calc Af Amer >60 >60 mL/min   Anion gap 7 5 - 15    CLINICAL DATA:  Low pelvic pain after left salpingectomy on 05/16/2017.  EXAM: CT ABDOMEN AND PELVIS WITH CONTRAST  TECHNIQUE: Multidetector CT imaging of the abdomen and pelvis was performed using the standard protocol following bolus administration of intravenous contrast.  CONTRAST:  100 cc Isovue-300  COMPARISON:  None.  FINDINGS: Lower chest: Subsegmental atelectasis noted posterior lung bases.  Hepatobiliary: No focal abnormality within the liver parenchyma. Layering sludge noted in the gallbladder. No intrahepatic or extrahepatic biliary dilation.  Pancreas: No focal mass lesion. No dilatation of the main duct. No intraparenchymal cyst. No peripancreatic edema.  Spleen: No splenomegaly. No focal mass lesion.  Adrenals/Urinary Tract: No adrenal nodule or mass. 14 mm well-defined low-density lesion interpolar right kidney is compatible  with a cyst. Left kidney unremarkable. No evidence for hydroureter. Trace amount a gas in the urinary bladder presumably related to recent instrumentation.  Stomach/Bowel: Stomach is nondistended. No gastric wall thickening. No evidence of outlet obstruction. Duodenum is normally positioned as is the ligament of Treitz. No small bowel wall thickening. No small bowel dilatation. The terminal ileum is normal. The appendix is normal. No gross colonic mass. No colonic wall thickening. No substantial diverticular change.  Vascular/Lymphatic: No abdominal aortic aneurysm. No abdominal aortic atherosclerotic calcification. Portal vein and superior mesenteric vein are patent. There is no gastrohepatic or hepatoduodenal ligament lymphadenopathy. No intraperitoneal or retroperitoneal lymphadenopathy. No pelvic sidewall lymphadenopathy. 8 mm short axis right common iliac lymph node is upper normal.  Reproductive: Uterus unremarkable. No overt mass lesion evident in either adnexal region.  Other: Small volume of high attenuation fluid in the cul-de-sac is compatible with hemoperitoneum. This collection measures 3 x 6.3 x 3.4 cm. There is some minimal fluid in the adnexal regions bilaterally. No fluid in the para colic gutters. No fluid around the liver or spleen. There is a tiny amount of gas in the prevesical space and in the soft tissues adjacent to the uterus, not unexpected postoperative day 5.  Musculoskeletal: Bone windows reveal no worrisome lytic or sclerotic osseous lesions.  IMPRESSION: 1. Small collection of hemoperitoneum in the cul-de-sac without overt fluid in either adnexal region. No fluid identified in the para colic gutters or in the abdomen. No evidence for active contrast extravasation to suggest ongoing bleeding. 2. Tiny amount a gas in the prevesical space and central pelvis, not unexpected on postoperative day 5. 3. Tiny gas bubble in the bladder likely  related to recent instrumentation.  I discussed these findings by telephone with Nolene Bernheim at 1349 hours on 05/21/2017.  MDM 1030 consult with Dr. Erin Fulling on plan of care. 1405  Reviewed the results of the CT with the radiologist. 1444  Reviewed CT results with Dr. Emelda Fear and he  came and talked with client.  Client unable to tolerate a change in her dressing.  There has been a 3-4 inch increase in area of blood on the left side of the dressing - does not go all the way to the edge of the dressing.  Dr. Emelda FearFerguson also reviewed with results of the CT scan and labs with client.  Discussed the expectations of her home recovery - alternate ice pack and heating pad for 20 minutes every 2 hours, take ibuprofen and then supplement with percocet if needed.  Do not be only in the bed every day - sit up for periods of time.  OK to shower daily.  Take meds for anxiety and stay with a friend or relative while you are healing.  Expect it to be 2 weeks before you are feeling lots better.  Assessment and Plan  Post Op Pain Anxiety  Plan  Keep your appointment on Monday for a recheck in the clinic. Return if you are having a fever over 100.4 or worsening pain that is not helped by pain medication. Alternate ice pack and heating pad for 20 minutes every 2 hours,  take ibuprofen and then supplement with percocet if needed.   Do not be only in the bed all day - sit up for periods of time.   OK to shower daily.   Take meds for anxiety and stay with a friend or relative while you are healing.   Expect it to be 2 weeks before you are feeling lots better.  Tieisha Darden L Kiani Wurtzel 05/21/2017, 10:33 AM

## 2017-05-21 NOTE — MAU Note (Signed)
Pt had surgery 3 days ago for ruptured ectopic. Stated she coughed last night and felt something pull and has had abd pain ever since. Has been taking Ibuprofen and percocet as prescribed without much relief

## 2017-05-21 NOTE — Discharge Instructions (Signed)
Keep your appointment on Monday for a recheck in the clinic. Return if you are having a fever over 100.4 or worsening pain that is not helped by pain medication. Alternate ice pack and heating pad for 20 minutes every 2 hours,  take ibuprofen and then supplement with percocet if needed.   Do not be only in the bed all day - sit up for periods of time.   OK to shower daily.   Take meds for anxiety and stay with a friend or relative while you are healing.   Expect it to be 2 weeks before you are feeling lots better.

## 2017-05-25 ENCOUNTER — Other Ambulatory Visit: Payer: Self-pay | Admitting: *Deleted

## 2017-05-25 ENCOUNTER — Other Ambulatory Visit: Payer: Medicaid Other

## 2017-05-25 DIAGNOSIS — O008 Other ectopic pregnancy without intrauterine pregnancy: Secondary | ICD-10-CM

## 2017-05-26 LAB — BETA HCG QUANT (REF LAB): HCG QUANT: 202 m[IU]/mL

## 2017-06-02 ENCOUNTER — Other Ambulatory Visit: Payer: Self-pay | Admitting: General Practice

## 2017-06-02 DIAGNOSIS — O008 Other ectopic pregnancy without intrauterine pregnancy: Secondary | ICD-10-CM

## 2017-06-03 ENCOUNTER — Other Ambulatory Visit: Payer: Medicaid Other

## 2017-06-03 DIAGNOSIS — O008 Other ectopic pregnancy without intrauterine pregnancy: Secondary | ICD-10-CM

## 2017-06-04 LAB — BETA HCG QUANT (REF LAB): hCG Quant: 24 m[IU]/mL

## 2017-06-11 ENCOUNTER — Telehealth: Payer: Self-pay

## 2017-06-11 NOTE — Telephone Encounter (Signed)
Patient called and made aware that her HCG levels are falling appropriately.  Patient reminded of appointment tomorrow morning and she plans to come. Armandina StammerJennifer Dominik Yordy RNBSN

## 2017-06-11 NOTE — Telephone Encounter (Signed)
-----   Message from Tereso NewcomerUgonna A Anyanwu, MD sent at 06/04/2017 10:18 AM EDT ----- HCG levels going down appropriately after surgery for ruptured cornual ectopic pregnancy. Will check next level during visit on 06/12/17.  Please call to inform patient of results and recommendations.

## 2017-06-12 ENCOUNTER — Ambulatory Visit (INDEPENDENT_AMBULATORY_CARE_PROVIDER_SITE_OTHER): Payer: Medicaid Other | Admitting: Obstetrics & Gynecology

## 2017-06-12 ENCOUNTER — Encounter: Payer: Self-pay | Admitting: Obstetrics & Gynecology

## 2017-06-12 VITALS — BP 123/55 | HR 58 | Ht 68.0 in | Wt 177.4 lb

## 2017-06-12 DIAGNOSIS — O008 Other ectopic pregnancy without intrauterine pregnancy: Secondary | ICD-10-CM

## 2017-06-12 DIAGNOSIS — Z09 Encounter for follow-up examination after completed treatment for conditions other than malignant neoplasm: Secondary | ICD-10-CM

## 2017-06-12 DIAGNOSIS — Z30011 Encounter for initial prescription of contraceptive pills: Secondary | ICD-10-CM

## 2017-06-12 DIAGNOSIS — Z9889 Other specified postprocedural states: Secondary | ICD-10-CM

## 2017-06-12 MED ORDER — NORETHIN ACE-ETH ESTRAD-FE 1-20 MG-MCG(24) PO TABS: 1.0000 | ORAL_TABLET | Freq: Every day | ORAL | 11 refills | Status: DC

## 2017-06-12 NOTE — Patient Instructions (Signed)
Return to clinic for any scheduled appointments or for any gynecologic concerns as needed.   

## 2017-06-12 NOTE — Progress Notes (Signed)
Subjective:     Sara Rubio is a 22 y.o.  822P1011 female who presents to the clinic 4 weeks status post ELAP, left cornual wedge resection & salpingectomy for ruptured cornual ectopic pregnancy on 05/16/17. Eating a regular diet without difficulty. Bowel movements are normal. The patient is not having any pain.  The following portions of the patient's history were reviewed and updated as appropriate: allergies, current medications, past family history, past medical history, past social history, past surgical history and problem list.  Review of Systems Pertinent items noted in HPI and remainder of comprehensive ROS otherwise negative.    Objective:    BP (!) 123/55   Pulse (!) 58   Ht 5\' 8"  (1.727 m)   Wt 177 lb 6.4 oz (80.5 kg)   BMI 26.97 kg/m  General:  alert and no distress  Abdomen: soft, bowel sounds active, non-tender  Incision:   healing well, no drainage, no erythema, no hernia, no seroma, no swelling, no dehiscence, incision well approximated    05/16/2017  Surgical Pathology DILATED FALLOPIAN TUBE WITH ASSOCIATED CHORIONIC VILLI, CONSISTENT WITH CLINICALLY STATED ECTOPIC PREGNANCY.  Preop 23,920  Ref. Range 05/25/2017 09:34 06/03/2017 10:14  hCG Quant Latest Units: mIU/mL 202 24    Assessment:    Doing well postoperatively. Operative findings again reviewed. Pathology report discussed.    Plan:    1. Continue any current medications. OCPs prescribed for contraception.  HCG to be checked today to document resolution of cornual pregnancy. 2. Wound care discussed. 3. Activity restrictions: as per work letter 4. Anticipated return to work: work Physicist, medicalletter provided. 5. Follow up: 2 months    Sara CollinsUGONNA  Kyrell Ruacho, MD, FACOG Obstetrician & Gynecologist, [facility] [employer], [facility]

## 2017-06-13 LAB — BETA HCG QUANT (REF LAB): HCG QUANT: 6 m[IU]/mL

## 2017-06-15 ENCOUNTER — Telehealth: Payer: Self-pay

## 2017-06-15 NOTE — Telephone Encounter (Signed)
-----   Message from Tereso NewcomerUgonna A Anyanwu, MD sent at 06/15/2017  8:31 AM EDT ----- HCG is 6.  Redraw in next 1-2 weeks to ensure it is less than 5 (resolution of pregnancy).  Please call to inform patient of results and recommendations.

## 2017-06-15 NOTE — Telephone Encounter (Signed)
Patient is aware of HCG level and to have lab redrawn in 1-2 weeks. I have advised patient we will be calling her back for blood draw only. [facility].

## 2017-06-19 ENCOUNTER — Telehealth: Payer: Self-pay | Admitting: General Practice

## 2017-06-19 ENCOUNTER — Telehealth: Payer: Self-pay

## 2017-06-19 NOTE — Telephone Encounter (Signed)
Called pt to schedule f/u lab visit. Patient voiced understanding.

## 2017-06-19 NOTE — Telephone Encounter (Signed)
Pt called and stated that she had a laproscopy surgery was told by the provider to start taking BCP that coming Sunday.  Pt states that she read the instructions of the BCP and became confused when the BCP instructions stated to start pill on Sunday after period.  I explained to the pt that she should have taken it the Sunday following her surgery as the provider instructed because due to surgery it could cause her period to be irregular.  I advised pt to start taking it this Sunday at the same everyday.  Pt stated understanding with no further questions.

## 2017-06-22 ENCOUNTER — Other Ambulatory Visit: Payer: Self-pay

## 2017-06-22 ENCOUNTER — Other Ambulatory Visit: Payer: Medicaid Other

## 2017-06-22 DIAGNOSIS — O009 Unspecified ectopic pregnancy without intrauterine pregnancy: Secondary | ICD-10-CM

## 2017-06-23 ENCOUNTER — Telehealth: Payer: Self-pay

## 2017-06-23 LAB — BETA HCG QUANT (REF LAB): HCG QUANT: 2 m[IU]/mL

## 2017-06-23 NOTE — Telephone Encounter (Signed)
-----   Message from Catalina AntiguaPeggy Constant, MD sent at 06/23/2017  8:39 AM EDT ----- Complete resolution of pregnancy. No need for further blood work

## 2017-06-23 NOTE — Telephone Encounter (Signed)
Called patient in regards to recent lab work. Patient verbalizes understanding and will follow up on 08/12/2017 for bp check and ocp.

## 2017-07-02 ENCOUNTER — Telehealth: Payer: Self-pay | Admitting: General Practice

## 2017-07-02 NOTE — Telephone Encounter (Signed)
Patient called and left message on nurse voicemail line stating she has a question about her birth control & requests a callback. Called patient and she said she has never been on birth control before so she doesn't know what is normal. Patient states she started the pack on her period but a week later she is still bleeding. Patient reports light bleeding with some cramping. Discussed with patient irregular bleeding the first several pill packs is normal. Patient verbalized understanding and asked about refills. Told patient she will contact the pharmacy and they will get the prescription ready again for her each time. Patient verbalized understanding & had no questions

## 2017-07-23 ENCOUNTER — Telehealth: Payer: Self-pay

## 2017-07-23 NOTE — Telephone Encounter (Signed)
Pt called and left message complaining of nausea, vomiting, and diarrhea. Returned call, no answer, left message for pt to call back

## 2017-07-29 ENCOUNTER — Other Ambulatory Visit: Payer: Self-pay | Admitting: Obstetrics & Gynecology

## 2017-08-12 ENCOUNTER — Encounter: Payer: Self-pay | Admitting: Obstetrics & Gynecology

## 2017-08-12 ENCOUNTER — Ambulatory Visit (INDEPENDENT_AMBULATORY_CARE_PROVIDER_SITE_OTHER): Payer: Medicaid Other | Admitting: Obstetrics & Gynecology

## 2017-08-12 VITALS — BP 130/72 | HR 78 | Ht 68.0 in | Wt 181.0 lb

## 2017-08-12 DIAGNOSIS — Z3042 Encounter for surveillance of injectable contraceptive: Secondary | ICD-10-CM

## 2017-08-12 DIAGNOSIS — A6000 Herpesviral infection of urogenital system, unspecified: Secondary | ICD-10-CM

## 2017-08-12 DIAGNOSIS — Z3041 Encounter for surveillance of contraceptive pills: Secondary | ICD-10-CM

## 2017-08-12 DIAGNOSIS — Z3009 Encounter for other general counseling and advice on contraception: Secondary | ICD-10-CM | POA: Diagnosis not present

## 2017-08-12 MED ORDER — MEDROXYPROGESTERONE ACETATE 150 MG/ML IM SUSP
150.0000 mg | Freq: Once | INTRAMUSCULAR | Status: AC
  Administered 2017-08-12: 150 mg via INTRAMUSCULAR

## 2017-08-12 MED ORDER — VALACYCLOVIR HCL 500 MG PO TABS: 500.0000 mg | ORAL_TABLET | Freq: Every day | ORAL | 6 refills | Status: DC

## 2017-08-12 NOTE — Patient Instructions (Signed)
Return to clinic for any scheduled appointments or for any gynecologic concerns as needed.   

## 2017-08-12 NOTE — Progress Notes (Signed)
GYNECOLOGY OFFICE VISIT NOTE  History:  22 y.o. G2P1011 here today for OCP/BP check. Hates the pills, made her break out, she forgets sometimes, irregular spotting. Desires Depo Provera.  Declines IUD or Nexplanon. Wants Valtrex suppression therapy.  She denies any abnormal vaginal  bleeding, pelvic pain or other concerns.   Past Medical History:  Diagnosis Date  . Anxiety 05/17/2017  . Genital herpes   . Headache   . Left cornual ectopic pregnancy 05/16/2017   8 week viable cornual ruptured ectopic pregnancy with ~500 ml of hemoperitoneum -> wedge resection Needs cesarean deliveries at 37 weeks for any subsequent pregnancies; labor is absolutely contraindicated.  . s/p ELAP, left cornual wedge resection & salpingectomy for ruptured cornual ectopic pregnancy on 05/16/17 05/16/2017  . Seasonal allergies     Past Surgical History:  Procedure Laterality Date  . LAPAROSCOPY N/A 05/16/2017   Procedure: LAPAROSCOPY DIAGNOSTIC;  Surgeon: Tereso Newcomer, MD;  Location: [facility];  Service: Gynecology;  Laterality: N/A;  . LAPAROTOMY N/A 05/16/2017   Procedure: EXPLORATORY LAPAROTOMY, LEFT CORNUAL WEDGE RESECTION ECTOPIC PREGNANCY;  Surgeon: Tereso Newcomer, MD;  Location: [facility];  Service: Gynecology;  Laterality: N/A;  . UNILATERAL SALPINGECTOMY Left 05/16/2017   Procedure: UNILATERAL SALPINGECTOMY;  Surgeon: Tereso Newcomer, MD;  Location: [facility];  Service: Gynecology;  Laterality: Left;  . WISDOM TOOTH EXTRACTION      The following portions of the patient's history were reviewed and updated as appropriate: allergies, current medications, past family history, past medical history, past social history, past surgical history and problem list.    Review of Systems:  Pertinent items noted in HPI and remainder of comprehensive ROS otherwise negative.  Objective:  Physical Exam BP 130/72   Pulse 78   Ht  (1.727 m)   Wt 181 lb (82.1 kg)   LMP 07/15/2017 (Within Days)    Breastfeeding? No   BMI 27.52 kg/m  CONSTITUTIONAL: Well-developed, well-nourished female in no acute distress.  HENT:  Normocephalic, atraumatic. External right and left ear normal. Oropharynx is clear and moist EYES: Conjunctivae and EOM are normal. Pupils are equal, round, and reactive to light. No scleral icterus.  NECK: Normal range of motion, supple, no masses SKIN: Skin is warm and dry. No rash noted. Not diaphoretic. No erythema. No pallor. NEUROLOGIC: Alert and oriented to person, place, and time. Normal reflexes, muscle tone coordination. No cranial nerve deficit noted. PSYCHIATRIC: Normal mood and affect. Normal behavior. Normal judgment and thought content. CARDIOVASCULAR: Normal heart rate noted RESPIRATORY: Effort and breath sounds normal, no problems with respiration noted ABDOMEN: Soft, no distention noted.   PELVIC: Deferred MUSCULOSKELETAL: Normal range of motion. No edema noted.  Labs and Imaging No results found.  Assessment & Plan:  1. Encounter for surveillance of contraceptive pills Discontinued, switched to Depo Provera  2. Encounter for Depo-Provera contraception - medroxyPROGESTERone (DEPO-PROVERA) injection 150 mg  3. Genital herpes simplex, unspecified site - valACYclovir (VALTREX) 500 MG tablet; Take 1 tablet (500 mg total) by mouth daily. Take two tablets daily for five days for any outbreak  Dispense: 90 tablet; Refill: 6   Routine preventative health maintenance measures emphasized. Please refer to After Visit Summary for other counseling recommendations.   Return in about 3 months (around 11/12/2017) for Depo Provera injection and pap smear.   Total face-to-face time with patient: 15 minutes.  Over 50% of encounter was spent on counseling and coordination of care.   Jaynie Collins, MD, FACOG Obstetrician &  Gynecologist, Product/process development scientist for [employer], [place]

## 2017-10-07 ENCOUNTER — Ambulatory Visit (HOSPITAL_COMMUNITY)
Admission: EM | Admit: 2017-10-07 | Discharge: 2017-10-07 | Disposition: A | Discharge status: Home / Self Care | Payer: Medicaid Other | Attending: Internal Medicine | Admitting: Internal Medicine

## 2017-10-07 ENCOUNTER — Encounter (HOSPITAL_COMMUNITY): Payer: Self-pay | Admitting: Emergency Medicine

## 2017-10-07 DIAGNOSIS — H209 Unspecified iridocyclitis: Secondary | ICD-10-CM

## 2017-10-07 MED ORDER — TETRACAINE HCL 0.5 % OP SOLN
OPHTHALMIC | Status: AC
  Filled 2017-10-07: qty 4

## 2017-10-07 MED ORDER — PREDNISOLONE ACETATE 1 % OP SUSP: 1.0000 [drp] | Freq: Four times a day (QID) | OPHTHALMIC | 0 refills | Status: DC

## 2017-10-07 NOTE — ED Provider Notes (Signed)
[facility]    CSN: [id number] Arrival date & time: 10/07/17  0807     History   Chief Complaint Chief Complaint  Patient presents with  . Eye Drainage    HPI Sara Rubio is a 22 y.o. female negative past medical history presenting today for evaluation of right eye redness and drainage.  Patient states that she has had redness and irritation to her right eye for the past week.  Eyes described as sore, itchy with occasional blurry vision.  She does will have occasional crusting and drainage.  Denies wearing contacts.  Initially believed this to be her allergies, but symptoms have worsened.  Denies changes in vision besides occasional blurriness.  Denies associated URI symptoms.  HPI  Past Medical History:  Diagnosis Date  . Anxiety 05/17/2017  . Genital herpes   . Headache   . Left cornual ectopic pregnancy 05/16/2017   8 week viable cornual ruptured ectopic pregnancy with ~500 ml of hemoperitoneum -> wedge resection Needs cesarean deliveries at 37 weeks for any subsequent pregnancies; labor is absolutely contraindicated.  . s/p ELAP, left cornual wedge resection & salpingectomy for ruptured cornual ectopic pregnancy on 05/16/17 05/16/2017  . Seasonal allergies     Patient Active Problem List   Diagnosis Date Noted  . Anxiety 05/17/2017  . s/p ELAP, left cornual wedge resection & salpingectomy for ruptured cornual ectopic pregnancy on 05/16/17 05/16/2017  . Genital herpes     Past Surgical History:  Procedure Laterality Date  . LAPAROSCOPY N/A 05/16/2017   Procedure: LAPAROSCOPY DIAGNOSTIC;  Surgeon: Tereso Newcomer, MD;  Location: [facility];  Service: Gynecology;  Laterality: N/A;  . LAPAROTOMY N/A 05/16/2017   Procedure: EXPLORATORY LAPAROTOMY, LEFT CORNUAL WEDGE RESECTION ECTOPIC PREGNANCY;  Surgeon: Tereso Newcomer, MD;  Location: [facility];  Service: Gynecology;  Laterality: N/A;  . UNILATERAL SALPINGECTOMY Left 05/16/2017   Procedure: UNILATERAL SALPINGECTOMY;   Surgeon: Tereso Newcomer, MD;  Location: [facility];  Service: Gynecology;  Laterality: Left;  . WISDOM TOOTH EXTRACTION      OB History    Gravida  2   Para  1   Term  1   Preterm      AB  1   Living  1     SAB      TAB      Ectopic  1   Multiple  0   Live Births  1        Obstetric Comments  Pregnancy #2: 8 week viable cornual ruptured ectopic pregnancy with ~500 ml of hemoperitoneum -> wedge resection Needs cesarean deliveries at 37 weeks for any subsequent pregnancies; labor is absolutely contraindicated.         Home Medications    Prior to Admission medications   Medication Sig Start Date End Date Taking? Authorizing Provider  hydrOXYzine (VISTARIL) 25 MG capsule Take 25 mg by mouth 3 (three) times daily as needed.   Yes [provider]  ibuprofen (ADVIL,MOTRIN) 600 MG tablet Take 1 tablet (600 mg total) by mouth every 6 (six) hours as needed (mild pain). 05/17/17   Anyanwu, Jethro Bastos, MD  oxymetazoline (AFRIN) 0.05 % nasal spray Place 1 spray into both nostrils 2 (two) times daily as needed for congestion.    [provider]  prednisoLONE acetate (PRED FORTE) 1 % ophthalmic suspension Place 1 drop into the right eye 4 (four) times daily. 10/07/17   Wieters, Hallie C, PA-C  valACYclovir (VALTREX) 500 MG tablet Take  1 tablet (500 mg total) by mouth daily. Take two tablets daily for five days for any outbreak 08/12/17   Anyanwu, Jethro BastosUgonna A, MD    Family History No family history on file.  Social History Social History   Tobacco Use  . Smoking status: Current Every Day Smoker    Last attempt to quit: 10/18/2012    Years since quitting: 4.9  . Smokeless tobacco: Never Used  Substance Use Topics  . Alcohol use: No  . Drug use: Yes    Types: Marijuana    Comment: last useDec 1 2015     Allergies   Patient has no known allergies.   Review of Systems Review of Systems  Constitutional: Negative for activity change, appetite change,  chills, fatigue and fever.  HENT: Negative for congestion, ear pain, rhinorrhea, sinus pressure, sore throat and trouble swallowing.   Eyes: Positive for photophobia, pain, redness and itching. Negative for discharge and visual disturbance.  Respiratory: Negative for cough, chest tightness and shortness of breath.   Cardiovascular: Negative for chest pain.  Gastrointestinal: Negative for abdominal pain, diarrhea, nausea and vomiting.  Musculoskeletal: Negative for myalgias.  Skin: Negative for rash.  Neurological: Negative for dizziness, light-headedness and headaches.     Physical Exam Triage Vital Signs ED Triage Vitals [10/07/17 0845]  Enc Vitals Group     BP 111/64     Pulse Rate 62     Resp      Temp 98.1 F (36.7 C)     Temp Source Oral     SpO2 100 %     Weight      Height      Head Circumference      Peak Flow      Pain Score      Pain Loc      Pain Edu?      Excl. in GC?    No data found.  Updated Vital Signs BP 111/64 (BP Location: Left Arm)   Pulse 62   Temp 98.1 F (36.7 C) (Oral)   SpO2 100%   Visual Acuity Right Eye Distance: 20/25 without corrective lens Left Eye Distance: 20/20 without corrective lens Bilateral Distance: 20/20 without corrective lens  Right Eye Near:   Left Eye Near:    Bilateral Near:     Physical Exam  Constitutional: She appears well-developed and well-nourished. No distress.  HENT:  Head: Normocephalic and atraumatic.  Mouth/Throat: Oropharynx is clear and moist.  Bilateral ears without tenderness to palpation of external auricle, tragus and mastoid, EAC's without erythema or swelling, TM's with good bony landmarks and cone of light. Non erythematous.  Oral mucosa pink and moist, no tonsillar enlargement or exudate. Posterior pharynx patent and nonerythematous, no uvula deviation or swelling. Normal phonation.  Eyes: Pupils are equal, round, and reactive to light. Conjunctivae and EOM are normal.  Patient has erythema  located around  lateral limbal area of right eye, conjunctive does not appear increased swelling of the erythematous compared to left side.  Patient has photophobia with examination of pupils and light.  No uptake or abrasion/ulcer seen with fluorescein staining.  Neck: Neck supple.  Cardiovascular: Normal rate and regular rhythm.  No murmur heard. Pulmonary/Chest: Effort normal and breath sounds normal. No respiratory distress.  Abdominal: Soft. There is no tenderness.  Musculoskeletal: She exhibits no edema.  Neurological: She is alert.  Skin: Skin is warm and dry.  Psychiatric: She has a normal mood and affect.  Nursing note and vitals  reviewed.    UC Treatments / Results  Labs (all labs ordered are listed, but only abnormal results are displayed) Labs Reviewed - No data to display  EKG None  Radiology No results found.  Procedures Procedures (including critical care time)  Medications Ordered in UC Medications - No data to display  Initial Impression / Assessment and Plan / UC Course  I have reviewed the triage vital signs and the nursing notes.  Pertinent labs & imaging results that were available during my care of the patient were reviewed by me and considered in my medical decision making (see chart for details).     Patient symptoms concerning for iritis, will start on Pred poor for today, follow-up with ophthalmology.  Information for [employer] given.  Patient verbalized understanding with need to follow-up.  Return sooner if developing changes in vision or worsening pain.Discussed strict return precautions. Patient verbalized understanding and is agreeable with plan.  Final Clinical Impressions(s) / UC Diagnoses   Final diagnoses:  Iritis     Discharge Instructions     Begin prednisolone eye drops every 6 hours  Follow up with ophthalmology- [facility]  Go to emergency room if developing worsening pain or vision  changes    ED Prescriptions    Medication Sig Dispense Auth. Provider   prednisoLONE acetate (PRED FORTE) 1 % ophthalmic suspension Place 1 drop into the right eye 4 (four) times daily. 5 mL Wieters, Hallie C, PA-C     Controlled Substance Prescriptions [place] Controlled Substance Registry consulted? Not Applicable   Lew Dawes, [place] 10/07/17 1340

## 2017-10-07 NOTE — Discharge Instructions (Addendum)
Begin prednisolone eye drops every 6 hours  [facility]  Go to emergency room if developing worsening pain or vision changes

## 2017-10-07 NOTE — ED Triage Notes (Signed)
Pt c/o redness, drainage in R eye x1 week.

## 2017-11-09 ENCOUNTER — Ambulatory Visit: Payer: Medicaid Other

## 2018-01-23 ENCOUNTER — Inpatient Hospital Stay (HOSPITAL_COMMUNITY)
Admission: AD | Admit: 2018-01-23 | Discharge: 2018-01-23 | Disposition: A | Discharge status: Home / Self Care | Payer: No Typology Code available for payment source | Source: Ambulatory Visit | Attending: Obstetrics and Gynecology | Admitting: Obstetrics and Gynecology

## 2018-01-23 ENCOUNTER — Encounter (HOSPITAL_COMMUNITY): Payer: Self-pay | Admitting: *Deleted

## 2018-01-23 DIAGNOSIS — F17200 Nicotine dependence, unspecified, uncomplicated: Secondary | ICD-10-CM | POA: Insufficient documentation

## 2018-01-23 DIAGNOSIS — A6000 Herpesviral infection of urogenital system, unspecified: Secondary | ICD-10-CM | POA: Insufficient documentation

## 2018-01-23 DIAGNOSIS — Z789 Other specified health status: Secondary | ICD-10-CM

## 2018-01-23 DIAGNOSIS — R1030 Lower abdominal pain, unspecified: Secondary | ICD-10-CM | POA: Diagnosis not present

## 2018-01-23 DIAGNOSIS — Z3202 Encounter for pregnancy test, result negative: Secondary | ICD-10-CM | POA: Diagnosis not present

## 2018-01-23 DIAGNOSIS — B009 Herpesviral infection, unspecified: Secondary | ICD-10-CM | POA: Diagnosis not present

## 2018-01-23 DIAGNOSIS — R109 Unspecified abdominal pain: Secondary | ICD-10-CM | POA: Diagnosis present

## 2018-01-23 LAB — URINALYSIS, ROUTINE W REFLEX MICROSCOPIC
Bilirubin Urine: NEGATIVE
Glucose, UA: NEGATIVE mg/dL
Hgb urine dipstick: NEGATIVE
Ketones, ur: NEGATIVE mg/dL
Nitrite: NEGATIVE
Protein, ur: NEGATIVE mg/dL
Specific Gravity, Urine: 1.021 (ref 1.005–1.030)
pH: 6 (ref 5.0–8.0)

## 2018-01-23 LAB — POCT PREGNANCY, URINE: PREG TEST UR: NEGATIVE

## 2018-01-23 MED ORDER — KETOROLAC TROMETHAMINE 10 MG PO TABS
10.0000 mg | ORAL_TABLET | Freq: Once | ORAL | Status: AC
  Administered 2018-01-23: 10 mg via ORAL
  Filled 2018-01-23: qty 1

## 2018-01-23 MED ORDER — ACYCLOVIR 800 MG PO TABS: 800.0000 mg | ORAL_TABLET | Freq: Two times a day (BID) | ORAL | 11 refills | Status: DC

## 2018-01-23 NOTE — MAU Provider Note (Signed)
History     CSN: [id number]  Arrival date and time: 01/23/18 1343   First Provider Initiated Contact with Patient 01/23/18 1504      Chief Complaint  Patient presents with  . Abdominal Pain   Sara Rubio is a 22 y.o. G2P1 not currently pregnant who presents to MAU with complaints of abdominal pain. She reports abdominal pain has been occurring for the past 2 days, describes abdominal pain as sharp and cramping that is intermittent. Rates pain 1-2/10, has not taken any medication for abdominal pain. Hx of ectopic pregnancy in February where left fallopian tube was removed. Reports LMP being Late September around 9/18. HPT was negative. She reports hx of HSV which she was recently diagnosed with in May. Currently taking Valtrex during outbreaks.   OB History    Gravida  2   Para  1   Term  1   Preterm      AB  1   Living  1     SAB      TAB      Ectopic  1   Multiple  0   Live Births  1        Obstetric Comments  Pregnancy #2: 8 week viable cornual ruptured ectopic pregnancy with ~500 ml of hemoperitoneum -> wedge resection Needs cesarean deliveries at 37 weeks for any subsequent pregnancies; labor is absolutely contraindicated.        Past Medical History:  Diagnosis Date  . Anxiety 05/17/2017  . Genital herpes   . Headache   . Left cornual ectopic pregnancy 05/16/2017   8 week viable cornual ruptured ectopic pregnancy with ~500 ml of hemoperitoneum -> wedge resection Needs cesarean deliveries at 37 weeks for any subsequent pregnancies; labor is absolutely contraindicated.  . s/p ELAP, left cornual wedge resection & salpingectomy for ruptured cornual ectopic pregnancy on 05/16/17 05/16/2017  . Seasonal allergies     Past Surgical History:  Procedure Laterality Date  . LAPAROSCOPY N/A 05/16/2017   Procedure: LAPAROSCOPY DIAGNOSTIC;  Surgeon: Tereso Newcomer, MD;  Location: [facility];  Service: Gynecology;  Laterality: N/A;  . LAPAROTOMY N/A 05/16/2017   Procedure: EXPLORATORY LAPAROTOMY, LEFT CORNUAL WEDGE RESECTION ECTOPIC PREGNANCY;  Surgeon: Tereso Newcomer, MD;  Location: [facility];  Service: Gynecology;  Laterality: N/A;  . UNILATERAL SALPINGECTOMY Left 05/16/2017   Procedure: UNILATERAL SALPINGECTOMY;  Surgeon: Tereso Newcomer, MD;  Location: [facility];  Service: Gynecology;  Laterality: Left;  . WISDOM TOOTH EXTRACTION      History reviewed. No pertinent family history.  Social History   Tobacco Use  . Smoking status: Current Every Day Smoker    Last attempt to quit: 10/18/2012    Years since quitting: 5.2  . Smokeless tobacco: Never Used  Substance Use Topics  . Alcohol use: No  . Drug use: Yes    Types: Marijuana    Comment: last useDec 1 2015    Allergies: No Known Allergies  Medications Prior to Admission  Medication Sig Dispense Refill Last Dose  . hydrOXYzine (VISTARIL) 25 MG capsule Take 25 mg by mouth 3 (three) times daily as needed.     Marland Kitchen ibuprofen (ADVIL,MOTRIN) 600 MG tablet Take 1 tablet (600 mg total) by mouth every 6 (six) hours as needed (mild pain). 60 tablet 2 Taking  . oxymetazoline (AFRIN) 0.05 % nasal spray Place 1 spray into both nostrils 2 (two) times daily as needed for congestion.   Taking  . prednisoLONE acetate (PRED FORTE)  1 % ophthalmic suspension Place 1 drop into the right eye 4 (four) times daily. 5 mL 0   . valACYclovir (VALTREX) 500 MG tablet Take 1 tablet (500 mg total) by mouth daily. Take two tablets daily for five days for any outbreak 90 tablet 6     Review of Systems  Constitutional: Negative.   Respiratory: Negative.   Cardiovascular: Negative.   Gastrointestinal: Positive for abdominal pain. Negative for constipation, diarrhea, nausea and vomiting.  Neurological: Negative.    Physical Exam   Blood pressure 140/89, pulse 86, temperature 97.9 F (36.6 C), temperature source Oral, resp. rate 18, weight 86.2 kg, last menstrual period 12/09/2017, SpO2 100 %.  Physical Exam  Nursing  note and vitals reviewed. Constitutional: She is oriented to person, place, and time. She appears well-developed and well-nourished. No distress.  Cardiovascular: Normal rate, regular rhythm and normal heart sounds.  Respiratory: Effort normal and breath sounds normal. No respiratory distress. She has no wheezes.  GI: Soft. She exhibits no distension. There is no tenderness. There is no rebound.  Genitourinary:  Genitourinary Comments: Patient declined pelvic examination and vaginal swabs  Neurological: She is alert and oriented to person, place, and time.  Psychiatric: She has a normal mood and affect. Her behavior is normal. Thought content normal.    MAU Course  Procedures  MDM Orders Placed This Encounter  Procedures  . Wet prep, genital  . Urine Culture  . Urinalysis, Routine w reflex microscopic  . Pregnancy, urine POC  . Discharge patient Discharge disposition: 01-Home or Self Care; Discharge patient date: 01/23/2018   Results for orders placed or performed during the hospital encounter of 01/23/18 (from the past 24 hour(s))  Urinalysis, Routine w reflex microscopic     Status: Abnormal   Collection Time: 01/23/18  2:43 PM  Result Value Ref Range   Color, Urine YELLOW YELLOW   APPearance HAZY (A) CLEAR   Specific Gravity, Urine 1.021 1.005 - 1.030   pH 6.0 5.0 - 8.0   Glucose, UA NEGATIVE NEGATIVE mg/dL   Hgb urine dipstick NEGATIVE NEGATIVE   Bilirubin Urine NEGATIVE NEGATIVE   Ketones, ur NEGATIVE NEGATIVE mg/dL   Protein, ur NEGATIVE NEGATIVE mg/dL   Nitrite NEGATIVE NEGATIVE   Leukocytes, UA SMALL (A) NEGATIVE   RBC / HPF 0-5 0 - 5 RBC/hpf   WBC, UA 6-10 0 - 5 WBC/hpf   Bacteria, UA RARE (A) NONE SEEN   Squamous Epithelial / LPF 0-5 0 - 5   Mucus PRESENT   Pregnancy, urine POC     Status: None   Collection Time: 01/23/18  2:47 PM  Result Value Ref Range   Preg Test, Ur NEGATIVE NEGATIVE   UPT negative  Wet prep and GC/C refused by patient  Urine culture  pending   Patient reports currently having HSV outbreak that started yesterday and does not want pelvic examination. Educated and discussed physiology of HSV and it being systemically dormant which can cause back pain, abdominal pain, or pelvic pain with outbreaks. Rx for Acyclovir sent to pharmacy of choice. Toradol PO given for pain. Pt stable at time of discharge. Discussed reasons to return to MAU.    Assessment and Plan   1. Lower abdominal pain   2. HSV-2 (herpes simplex virus 2) infection   3. Not currently pregnant    Discharge home  Rx for Acyclovir sent to pharmacy of choice  Discussed reasons to return to MAU  Encourage safe practices with intercourse  Sharyon Cable CNM 01/23/2018, 3:45 PM

## 2018-01-23 NOTE — MAU Note (Signed)
Sara Rubio is a 22 y.o.  here in MAU reporting: +lower abdominal pain. Reports recent surgery in February to remove fallopian tube for an ectopic pregnancy. LMP: 9/18 ? Unsure of dates States HPT was negative. Pain score: 1-2/10. Intermittent. Sharp and cramping.   Lab orders placed from triage:ua and pregnancy test.

## 2018-01-24 LAB — URINE CULTURE: Culture: 50000 — AB

## 2018-05-24 ENCOUNTER — Ambulatory Visit (HOSPITAL_COMMUNITY)
Admission: EM | Admit: 2018-05-24 | Discharge: 2018-05-24 | Disposition: A | Discharge status: Home / Self Care | Payer: No Typology Code available for payment source | Attending: Family Medicine | Admitting: Family Medicine

## 2018-05-24 ENCOUNTER — Encounter (HOSPITAL_COMMUNITY): Payer: Self-pay | Admitting: Emergency Medicine

## 2018-05-24 DIAGNOSIS — N946 Dysmenorrhea, unspecified: Secondary | ICD-10-CM | POA: Diagnosis not present

## 2018-05-24 DIAGNOSIS — K529 Noninfective gastroenteritis and colitis, unspecified: Secondary | ICD-10-CM

## 2018-05-24 MED ORDER — DICLOFENAC SODIUM 75 MG PO TBEC: 75.0000 mg | DELAYED_RELEASE_TABLET | Freq: Two times a day (BID) | ORAL | 0 refills | Status: DC

## 2018-05-24 MED ORDER — ONDANSETRON 4 MG PO TBDP: 4.0000 mg | ORAL_TABLET | Freq: Three times a day (TID) | ORAL | 0 refills | Status: DC | PRN

## 2018-05-24 NOTE — Discharge Instructions (Addendum)

## 2018-05-24 NOTE — ED Triage Notes (Signed)
Pt sts N/V/D starting this am 

## 2018-05-24 NOTE — ED Provider Notes (Signed)
[facility]   [id number] 05/24/18 Arrival Time: 1136  ASSESSMENT & PLAN:  1. Gastroenteritis   2. Menstrual cramps    Meds ordered this encounter  Medications  . diclofenac (VOLTAREN) 75 MG EC tablet    Sig: Take 1 tablet (75 mg total) by mouth 2 (two) times daily.    Dispense:  14 tablet    Refill:  0  . ondansetron (ZOFRAN-ODT) 4 MG disintegrating tablet    Sig: Take 1 tablet (4 mg total) by mouth every 8 (eight) hours as needed for nausea or vomiting.    Dispense:  15 tablet    Refill:  0   Discussed typical duration of symptoms for suspected viral GI illness. Benign abdominal exam. No indication for urgent abdominal imaging at thist time. Discussed. Will do her best to ensure adequate fluid intake in order to avoid dehydration. Will proceed to the Emergency Department for evaluation if unable to tolerate PO fluids regularly.  Otherwise she will f/u with her PCP or here if not showing improvement over the next 48-72 hours.  Reviewed expectations re: course of current medical issues. Questions answered. Outlined signs and symptoms indicating need for more acute intervention. Patient verbalized understanding. After Visit Summary given.   SUBJECTIVE: History from: patient.  Sara Rubio is a 23 y.o. female who presents with complaint of non-bilious, non-bloody intermittent nausea and vomiting of brown material with non-bloody diarrhea. Onset today. Abdominal discomfort: mild and cramping; beginning after v/d. Symptoms are gradually improving since beginning. Aggravating factors: eating. Alleviating factors: none. Associated symptoms: "feel tired". She denies belching, dysuria, fever, headache and sweats. Appetite: decreased. PO intake: decreased. Ambulatory without assistance. Urinary symptoms: none. Sick contacts: none. Recent travel or camping: none. OTC treatment: none.  Patient's last menstrual period was 05/24/2018. Complains of "bad menstrual cramps". No  home tx. No specific pelvic pain or back pain.  Past Surgical History:  Procedure Laterality Date  . LAPAROSCOPY N/A 05/16/2017   Procedure: LAPAROSCOPY DIAGNOSTIC;  Surgeon: Tereso Newcomer, MD;  Location: [facility];  Service: Gynecology;  Laterality: N/A;  . LAPAROTOMY N/A 05/16/2017   Procedure: EXPLORATORY LAPAROTOMY, LEFT CORNUAL WEDGE RESECTION ECTOPIC PREGNANCY;  Surgeon: Tereso Newcomer, MD;  Location: [facility];  Service: Gynecology;  Laterality: N/A;  . UNILATERAL SALPINGECTOMY Left 05/16/2017   Procedure: UNILATERAL SALPINGECTOMY;  Surgeon: Tereso Newcomer, MD;  Location: [facility];  Service: Gynecology;  Laterality: Left;  . WISDOM TOOTH EXTRACTION     ROS: As per HPI. All other systems negative.  OBJECTIVE:  Vitals:   05/24/18 1203  BP: 126/72  Pulse: 99  Resp: 18  Temp: 98.2 F (36.8 C)  TempSrc: Temporal  SpO2: 100%    General appearance: alert; no distress Oropharynx: moist Lungs: clear to auscultation bilaterally; unlabored Heart: regular rate and rhythm Abdomen: soft; non-distended; no significant abdominal tenderness; reports "cramping" feeling; bowel sounds present; no masses or organomegaly; no guarding or rebound tenderness Back: no CVA tenderness Extremities: no edema; symmetrical with no gross deformities Skin: warm; dry Neurologic: normal gait Psychological: alert and cooperative; normal mood and affect  No Known Allergies                                             Past Medical History:  Diagnosis Date  . Anxiety 05/17/2017  . Genital herpes   . Headache   .  Left cornual ectopic pregnancy 05/16/2017   8 week viable cornual ruptured ectopic pregnancy with ~500 ml of hemoperitoneum -> wedge resection Needs cesarean deliveries at 37 weeks for any subsequent pregnancies; labor is absolutely contraindicated.  . s/p ELAP, left cornual wedge resection & salpingectomy for ruptured cornual ectopic pregnancy on 05/16/17 05/16/2017  . Seasonal allergies     Social History   Socioeconomic History  . Marital status: Single    Spouse name: Not on file  . Number of children: Not on file  . Years of education: Not on file  . Highest education level: Not on file  Occupational History  . Not on file  Social Needs  . Financial resource strain: Not on file  . Food insecurity:    Worry: Not on file    Inability: Not on file  . Transportation needs:    Medical: Not on file    Non-medical: Not on file  Tobacco Use  . Smoking status: Current Every Day Smoker    Last attempt to quit: 10/18/2012    Years since quitting: 5.6  . Smokeless tobacco: Never Used  Substance and Sexual Activity  . Alcohol use: No  . Drug use: Yes    Types: Marijuana    Comment: last useDec 1 2015  . Sexual activity: Yes    Birth control/protection: None  Lifestyle  . Physical activity:    Days per week: Not on file    Minutes per session: Not on file  . Stress: Not on file  Relationships  . Social connections:    Talks on phone: Not on file    Gets together: Not on file    Attends religious service: Not on file    Active member of club or organization: Not on file    Attends meetings of clubs or organizations: Not on file    Relationship status: Not on file  . Intimate partner violence:    Fear of current or ex partner: Not on file    Emotionally abused: Not on file    Physically abused: Not on file    Forced sexual activity: Not on file  Other Topics Concern  . Not on file  Social History Narrative  . Not on file   Family History  Problem Relation Age of Onset  . Diabetes Father      Mardella Layman, MD 05/26/18 [phone]

## 2018-09-07 ENCOUNTER — Encounter (HOSPITAL_COMMUNITY): Payer: Self-pay

## 2018-09-07 ENCOUNTER — Other Ambulatory Visit: Payer: Self-pay

## 2018-09-07 ENCOUNTER — Ambulatory Visit (HOSPITAL_COMMUNITY)
Admission: EM | Admit: 2018-09-07 | Discharge: 2018-09-07 | Disposition: A | Discharge status: Home / Self Care | Payer: No Typology Code available for payment source | Attending: Family Medicine | Admitting: Family Medicine

## 2018-09-07 DIAGNOSIS — Z113 Encounter for screening for infections with a predominantly sexual mode of transmission: Secondary | ICD-10-CM | POA: Diagnosis present

## 2018-09-07 NOTE — ED Provider Notes (Signed)
[facility]    CSN: [id number] Arrival date & time: 09/07/18  1411      History   Chief Complaint Chief Complaint  Patient presents with  . SEXUALLY TRANSMITTED DISEASE    HPI Sara Rubio is a 23 y.o. female.  She present today for STD screening. She denies any symptoms. Patient's last menstrual period was 08/30/2018.  HPI  Past Medical History:  Diagnosis Date  . Anxiety 05/17/2017  . Genital herpes   . Headache   . Left cornual ectopic pregnancy 05/16/2017   8 week viable cornual ruptured ectopic pregnancy with ~500 ml of hemoperitoneum -> wedge resection Needs cesarean deliveries at 37 weeks for any subsequent pregnancies; labor is absolutely contraindicated.  . s/p ELAP, left cornual wedge resection & salpingectomy for ruptured cornual ectopic pregnancy on 05/16/17 05/16/2017  . Seasonal allergies     Patient Active Problem List   Diagnosis Date Noted  . Anxiety 05/17/2017  . s/p ELAP, left cornual wedge resection & salpingectomy for ruptured cornual ectopic pregnancy on 05/16/17 05/16/2017  . Genital herpes     Past Surgical History:  Procedure Laterality Date  . LAPAROSCOPY N/A 05/16/2017   Procedure: LAPAROSCOPY DIAGNOSTIC;  Surgeon: Tereso NewcomerAnyanwu, Ugonna A, MD;  Location: [facility];  Service: Gynecology;  Laterality: N/A;  . LAPAROTOMY N/A 05/16/2017   Procedure: EXPLORATORY LAPAROTOMY, LEFT CORNUAL WEDGE RESECTION ECTOPIC PREGNANCY;  Surgeon: Tereso NewcomerAnyanwu, Ugonna A, MD;  Location: [facility];  Service: Gynecology;  Laterality: N/A;  . UNILATERAL SALPINGECTOMY Left 05/16/2017   Procedure: UNILATERAL SALPINGECTOMY;  Surgeon: Tereso NewcomerAnyanwu, Ugonna A, MD;  Location: [facility];  Service: Gynecology;  Laterality: Left;  . WISDOM TOOTH EXTRACTION      OB History    Gravida  2   Para  1   Term  1   Preterm      AB  1   Living  1     SAB      TAB      Ectopic  1   Multiple  0   Live Births  1        Obstetric Comments  Pregnancy #2: 8 week viable cornual  ruptured ectopic pregnancy with ~500 ml of hemoperitoneum -> wedge resection Needs cesarean deliveries at 37 weeks for any subsequent pregnancies; labor is absolutely contraindicated.         Home Medications    Prior to Admission medications   Medication Sig Start Date End Date Taking? Authorizing Provider  acyclovir (ZOVIRAX) 800 MG tablet Take 1 tablet (800 mg total) by mouth 2 (two) times daily. For 5 days for any outbreak. 01/23/18   Sharyon Cableogers, Veronica C, CNM  diclofenac (VOLTAREN) 75 MG EC tablet Take 1 tablet (75 mg total) by mouth 2 (two) times daily. 05/24/18   Mardella LaymanHagler, Brian, MD  hydrOXYzine (VISTARIL) 25 MG capsule Take 25 mg by mouth 3 (three) times daily as needed.    [provider]  ibuprofen (ADVIL,MOTRIN) 600 MG tablet Take 1 tablet (600 mg total) by mouth every 6 (six) hours as needed (mild pain). 05/17/17   Anyanwu, Jethro BastosUgonna A, MD  ondansetron (ZOFRAN-ODT) 4 MG disintegrating tablet Take 1 tablet (4 mg total) by mouth every 8 (eight) hours as needed for nausea or vomiting. 05/24/18   Mardella LaymanHagler, Brian, MD  oxymetazoline (AFRIN) 0.05 % nasal spray Place 1 spray into both nostrils 2 (two) times daily as needed for congestion.    [provider]  prednisoLONE acetate (PRED FORTE) 1 % ophthalmic  suspension Place 1 drop into the right eye 4 (four) times daily. 10/07/17   Wieters, Junius CreamerHallie C, PA-C    Family History Family History  Problem Relation Age of Onset  . Diabetes Father     Social History Social History   Tobacco Use  . Smoking status: Current Every Day Smoker    Last attempt to quit: 10/18/2012    Years since quitting: 5.8  . Smokeless tobacco: Never Used  Substance Use Topics  . Alcohol use: No  . Drug use: Yes    Types: Marijuana    Comment: last useDec 1 2015     Allergies   Patient has no known allergies.   Review of Systems Review of Systems  Genitourinary: Negative for decreased urine volume, difficulty urinating, dyspareunia, dysuria,  enuresis, flank pain, frequency, genital sores, hematuria, menstrual problem, pelvic pain, urgency, vaginal bleeding, vaginal discharge and vaginal pain.     Physical Exam Triage Vital Signs ED Triage Vitals  Enc Vitals Group     BP 09/07/18 1437 (!) 141/79     Pulse Rate 09/07/18 1437 77     Resp 09/07/18 1437 18     Temp 09/07/18 1437 99.1 F (37.3 C)     Temp Source 09/07/18 1437 Oral     SpO2 09/07/18 1437 100 %     Weight 09/07/18 1438 201 lb (91.2 kg)     Height --      Head Circumference --      Peak Flow --      Pain Score --      Pain Loc --      Pain Edu? --      Excl. in GC? --    No data found.  Updated Vital Signs BP (!) 141/79 (BP Location: Right Arm)   Pulse 77   Temp 99.1 F (37.3 C) (Oral)   Resp 18   Wt 201 lb (91.2 kg)   LMP 08/30/2018   SpO2 100%   BMI 30.56 kg/m   Visual Acuity Right Eye Distance:   Left Eye Distance:   Bilateral Distance:    Right Eye Near:   Left Eye Near:    Bilateral Near:     Physical Exam Vitals signs and nursing note reviewed.  Constitutional:      General: She is not in acute distress.    Appearance: Normal appearance. She is not ill-appearing, toxic-appearing or diaphoretic.  HENT:     Head: Normocephalic.     Nose: Nose normal.     Mouth/Throat:     Pharynx: Oropharynx is clear.  Eyes:     Conjunctiva/sclera: Conjunctivae normal.  Neck:     Musculoskeletal: Normal range of motion.  Pulmonary:     Effort: Pulmonary effort is normal.  Abdominal:     Palpations: Abdomen is soft.     Tenderness: There is no abdominal tenderness.  Musculoskeletal: Normal range of motion.  Skin:    General: Skin is warm and dry.     Findings: No rash.  Neurological:     Mental Status: She is alert.  Psychiatric:        Mood and Affect: Mood normal.      UC Treatments / Results  Labs (all labs ordered are listed, but only abnormal results are displayed) Labs Reviewed  CERVICOVAGINAL ANCILLARY ONLY    EKG  None  Radiology No results found.  Procedures Procedures (including critical care time)  Medications Ordered in UC Medications - No data to  display  Initial Impression / Assessment and Plan / UC Course  I have reviewed the triage vital signs and the nursing notes.  Pertinent labs & imaging results that were available during my care of the patient were reviewed by me and considered in my medical decision making (see chart for details).     Sending self swab for testing Labs pending.  Final Clinical Impressions(s) / UC Diagnoses   Final diagnoses:  Screening for STD (sexually transmitted disease)     Discharge Instructions     We are checking you for STDs.  You can check your my chart online for results or we will call if anything is positive.    ED Prescriptions    None     Controlled Substance Prescriptions [place] Controlled Substance Registry consulted? Not Applicable   Orvan July, NP 09/07/18 1533

## 2018-09-07 NOTE — ED Triage Notes (Signed)
Pt states she just wants to get tested.

## 2018-09-07 NOTE — Discharge Instructions (Addendum)
We are checking you for STDs.  You can check your my chart online for results or we will call if anything is positive.

## 2018-09-08 LAB — CERVICOVAGINAL ANCILLARY ONLY
Chlamydia: NEGATIVE
Neisseria Gonorrhea: NEGATIVE
Trichomonas: NEGATIVE

## 2018-10-11 ENCOUNTER — Other Ambulatory Visit: Payer: Self-pay

## 2018-10-11 ENCOUNTER — Encounter (HOSPITAL_COMMUNITY): Payer: Self-pay | Admitting: *Deleted

## 2018-10-11 ENCOUNTER — Inpatient Hospital Stay (HOSPITAL_COMMUNITY)
Admission: AD | Admit: 2018-10-11 | Discharge: 2018-10-11 | Disposition: A | Discharge status: Home / Self Care | Payer: No Typology Code available for payment source | Attending: Obstetrics & Gynecology | Admitting: Obstetrics & Gynecology

## 2018-10-11 ENCOUNTER — Inpatient Hospital Stay (HOSPITAL_COMMUNITY): Payer: No Typology Code available for payment source

## 2018-10-11 DIAGNOSIS — O00101 Right tubal pregnancy without intrauterine pregnancy: Secondary | ICD-10-CM

## 2018-10-11 DIAGNOSIS — F17200 Nicotine dependence, unspecified, uncomplicated: Secondary | ICD-10-CM | POA: Diagnosis not present

## 2018-10-11 DIAGNOSIS — O99331 Smoking (tobacco) complicating pregnancy, first trimester: Secondary | ICD-10-CM | POA: Diagnosis not present

## 2018-10-11 DIAGNOSIS — R109 Unspecified abdominal pain: Secondary | ICD-10-CM | POA: Diagnosis not present

## 2018-10-11 DIAGNOSIS — O9989 Other specified diseases and conditions complicating pregnancy, childbirth and the puerperium: Secondary | ICD-10-CM | POA: Diagnosis present

## 2018-10-11 DIAGNOSIS — Z8759 Personal history of other complications of pregnancy, childbirth and the puerperium: Secondary | ICD-10-CM | POA: Insufficient documentation

## 2018-10-11 DIAGNOSIS — Z791 Long term (current) use of non-steroidal anti-inflammatories (NSAID): Secondary | ICD-10-CM | POA: Diagnosis not present

## 2018-10-11 DIAGNOSIS — Z7952 Long term (current) use of systemic steroids: Secondary | ICD-10-CM | POA: Insufficient documentation

## 2018-10-11 DIAGNOSIS — Z3A01 Less than 8 weeks gestation of pregnancy: Secondary | ICD-10-CM | POA: Insufficient documentation

## 2018-10-11 LAB — COMPREHENSIVE METABOLIC PANEL
ALT: 24 U/L (ref 0–44)
AST: 18 U/L (ref 15–41)
Albumin: 4.2 g/dL (ref 3.5–5.0)
Alkaline Phosphatase: 59 U/L (ref 38–126)
Anion gap: 9 (ref 5–15)
BUN: 9 mg/dL (ref 6–20)
CO2: 22 mmol/L (ref 22–32)
Calcium: 8.8 mg/dL — ABNORMAL LOW (ref 8.9–10.3)
Chloride: 100 mmol/L (ref 98–111)
Creatinine, Ser: 0.74 mg/dL (ref 0.44–1.00)
GFR calc Af Amer: >60 mL/min (ref >60)
GFR calc non Af Amer: >60 mL/min (ref >60)
Glucose, Bld: 90 mg/dL (ref 70–99)
Potassium: 3.5 mmol/L (ref 3.5–5.1)
Sodium: 131 mmol/L — ABNORMAL LOW (ref 135–145)
Total Bilirubin: 0.6 mg/dL (ref 0.3–1.2)
Total Protein: 7 g/dL (ref 6.5–8.1)

## 2018-10-11 LAB — URINALYSIS, ROUTINE W REFLEX MICROSCOPIC
Bilirubin Urine: NEGATIVE
Glucose, UA: NEGATIVE mg/dL
Hgb urine dipstick: NEGATIVE
Ketones, ur: NEGATIVE mg/dL
Leukocytes,Ua: NEGATIVE
Nitrite: NEGATIVE
Protein, ur: NEGATIVE mg/dL
Specific Gravity, Urine: 1.02 (ref 1.005–1.030)
pH: 7 (ref 5.0–8.0)

## 2018-10-11 LAB — CBC
HCT: 41.2 % (ref 36.0–46.0)
Hemoglobin: 14 g/dL (ref 12.0–15.0)
MCH: 31.1 pg (ref 26.0–34.0)
MCHC: 34 g/dL (ref 30.0–36.0)
MCV: 91.6 fL (ref 80.0–100.0)
Platelets: 285 10*3/uL (ref 150–400)
RBC: 4.5 MIL/uL (ref 3.87–5.11)
RDW: 11.6 % (ref 11.5–15.5)
WBC: 6.4 10*3/uL (ref 4.0–10.5)
nRBC: 0 % (ref 0.0–0.2)

## 2018-10-11 LAB — WET PREP, GENITAL
Clue Cells Wet Prep HPF POC: NONE SEEN
Sperm: NONE SEEN
Trich, Wet Prep: NONE SEEN
Yeast Wet Prep HPF POC: NONE SEEN

## 2018-10-11 LAB — POCT PREGNANCY, URINE: Preg Test, Ur: POSITIVE — AB

## 2018-10-11 LAB — HCG, QUANTITATIVE, PREGNANCY: hCG, Beta Chain, Quant, S: 891 m[IU]/mL — ABNORMAL HIGH (ref <5)

## 2018-10-11 MED ORDER — METHOTREXATE FOR ECTOPIC PREGNANCY
50.0000 mg/m2 | Freq: Once | INTRAMUSCULAR | Status: AC
  Administered 2018-10-11: 105 mg via INTRAMUSCULAR
  Filled 2018-10-11: qty 1

## 2018-10-11 MED ORDER — ACETAMINOPHEN 500 MG PO TABS
1000.0000 mg | ORAL_TABLET | Freq: Once | ORAL | Status: AC
  Administered 2018-10-11: 1000 mg via ORAL
  Filled 2018-10-11: qty 2

## 2018-10-11 NOTE — MAU Note (Signed)
.   Sara Rubio is a 23 y.o. at [redacted]w[redacted]d here in MAU stating that she went to have a termination this morning and the physician did not see anything in her uterus. Pain in her right lower abdomen. HX of ectopic in 2019 LMP: 08/30/18 Onset of complaint: 2 weeks  Pain score: 5 Vitals:   10/11/18 1154  BP: (!) 154/83  Pulse: 97  Resp: 16  Temp: 98 F (36.7 C)  SpO2: 100%     Lab orders placed from triage: UA/UPT

## 2018-10-11 NOTE — MAU Provider Note (Signed)
History     CSN: [id number]  Arrival date and time: 10/11/18 1114   First Provider Initiated Contact with Patient 10/11/18 1258      Chief Complaint  Patient presents with  . Possible Pregnancy   Sara Rubio is a 23 y.o. G5P1031 at 4080w0d by Unsure LMP.  She presents today for Possible Pregnancy.  She states that she was going to get a TAB today and was informed that a pregnancy was not noted in the uterus.  Patient confirms that she had an ectopic pregnancy in the past with removal of her left tube.  She states she started having cramping last week on the right side.  She states today the pain is intermittent and she rates a 7/10.  She reports she last ate yesterday evening.       OB History    Gravida  5   Para  1   Term  1   Preterm      AB  3   Living  1     SAB      TAB  2   Ectopic  1   Multiple  0   Live Births  1        Obstetric Comments  Pregnancy #2: 8 week viable cornual ruptured ectopic pregnancy with ~500 ml of hemoperitoneum -> wedge resection Needs cesarean deliveries at 37 weeks for any subsequent pregnancies; labor is absolutely contraindicated.        Past Medical History:  Diagnosis Date  . Anxiety 05/17/2017  . Genital herpes   . Headache   . Left cornual ectopic pregnancy 05/16/2017   8 week viable cornual ruptured ectopic pregnancy with ~500 ml of hemoperitoneum -> wedge resection Needs cesarean deliveries at 37 weeks for any subsequent pregnancies; labor is absolutely contraindicated.  . s/p ELAP, left cornual wedge resection & salpingectomy for ruptured cornual ectopic pregnancy on 05/16/17 05/16/2017  . Seasonal allergies     Past Surgical History:  Procedure Laterality Date  . LAPAROSCOPY N/A 05/16/2017   Procedure: LAPAROSCOPY DIAGNOSTIC;  Surgeon: Tereso NewcomerAnyanwu, Ugonna A, MD;  Location: [facility];  Service: Gynecology;  Laterality: N/A;  . LAPAROTOMY N/A 05/16/2017   Procedure: EXPLORATORY LAPAROTOMY, LEFT CORNUAL WEDGE RESECTION  ECTOPIC PREGNANCY;  Surgeon: Tereso NewcomerAnyanwu, Ugonna A, MD;  Location: [facility];  Service: Gynecology;  Laterality: N/A;  . UNILATERAL SALPINGECTOMY Left 05/16/2017   Procedure: UNILATERAL SALPINGECTOMY;  Surgeon: Tereso NewcomerAnyanwu, Ugonna A, MD;  Location: [facility];  Service: Gynecology;  Laterality: Left;  . WISDOM TOOTH EXTRACTION      Family History  Problem Relation Age of Onset  . Diabetes Father     Social History   Tobacco Use  . Smoking status: Current Every Day Smoker    Last attempt to quit: 10/18/2012    Years since quitting: 5.9  . Smokeless tobacco: Never Used  Substance Use Topics  . Alcohol use: No  . Drug use: Yes    Types: Marijuana    Comment: last useDec 1 2015    Allergies: No Known Allergies  Medications Prior to Admission  Medication Sig Dispense Refill Last Dose  . acyclovir (ZOVIRAX) 800 MG tablet Take 1 tablet (800 mg total) by mouth 2 (two) times daily. For 5 days for any outbreak. 10 tablet 11 10/10/2018 at Unknown time  . diclofenac (VOLTAREN) 75 MG EC tablet Take 1 tablet (75 mg total) by mouth 2 (two) times daily. 14 tablet 0   . hydrOXYzine (VISTARIL) 25 MG capsule  Take 25 mg by mouth 3 (three) times daily as needed.     Marland Kitchen ibuprofen (ADVIL,MOTRIN) 600 MG tablet Take 1 tablet (600 mg total) by mouth every 6 (six) hours as needed (mild pain). 60 tablet 2   . ondansetron (ZOFRAN-ODT) 4 MG disintegrating tablet Take 1 tablet (4 mg total) by mouth every 8 (eight) hours as needed for nausea or vomiting. 15 tablet 0   . oxymetazoline (AFRIN) 0.05 % nasal spray Place 1 spray into both nostrils 2 (two) times daily as needed for congestion.     . prednisoLONE acetate (PRED FORTE) 1 % ophthalmic suspension Place 1 drop into the right eye 4 (four) times daily. 5 mL 0     Review of Systems  Constitutional: Negative for chills and fever.  Respiratory: Negative for cough and shortness of breath.   Gastrointestinal: Positive for abdominal pain. Negative for constipation, diarrhea,  nausea and vomiting.  Genitourinary: Positive for vaginal bleeding (Spotting). Negative for difficulty urinating, dysuria, pelvic pain and vaginal discharge.  Neurological: Negative for dizziness, light-headedness and headaches.   Physical Exam   Blood pressure (!) 147/68, pulse 80, temperature 98 F (36.7 C), resp. rate 16, last menstrual period 08/30/2018, SpO2 100 %.   Vitals:   10/11/18 1238 10/11/18 1246 10/11/18 1301 10/11/18 1544  BP: (!) 128/56 (!) 130/59 125/77   Pulse: 78 88 87   Resp:      Temp:      SpO2:      Weight:    94.3 kg  Height:    5\' 8"  (1.727 m)     Physical Exam  Vitals reviewed. Constitutional: She is oriented to person, place, and time. She appears well-developed and well-nourished.  HENT:  Head: Normocephalic and atraumatic.  Eyes: Conjunctivae are normal.  Neck: Normal range of motion.  Cardiovascular: Normal rate.  Respiratory: Effort normal.  GI: Soft.  Genitourinary:    Vaginal discharge present.     Genitourinary Comments: Sterile Speculum Exam:  **Patient denies h/o vaginal trauma or sexual abuse, but very tearful during pelvic exam.  Provider able to talk patient through speculum exam with chaperone present.**  -Vaginal Vault: Pink Mucosa. Moderate amt -wet prep collected -Cervix: Unable to visualize d/t patient discomfort and anxiety.  -Bimanual Exam: Deferred   Musculoskeletal: Normal range of motion.  Neurological: She is alert and oriented to person, place, and time.  Skin: Skin is warm and dry.  Psychiatric: She has a normal mood and affect.    MAU Course  Procedures Results for orders placed or performed during the hospital encounter of 10/11/18 (from the past 24 hour(s))  Pregnancy, urine POC     Status: Abnormal   Collection Time: 10/11/18 11:51 AM  Result Value Ref Range   Preg Test, Ur POSITIVE (A) NEGATIVE  Urinalysis, Routine w reflex microscopic     Status: None   Collection Time: 10/11/18 11:52 AM  Result Value Ref  Range   Color, Urine YELLOW YELLOW   APPearance CLEAR CLEAR   Specific Gravity, Urine 1.020 1.005 - 1.030   pH 7.0 5.0 - 8.0   Glucose, UA NEGATIVE NEGATIVE mg/dL   Hgb urine dipstick NEGATIVE NEGATIVE   Bilirubin Urine NEGATIVE NEGATIVE   Ketones, ur NEGATIVE NEGATIVE mg/dL   Protein, ur NEGATIVE NEGATIVE mg/dL   Nitrite NEGATIVE NEGATIVE   Leukocytes,Ua NEGATIVE NEGATIVE  Wet prep, genital     Status: Abnormal   Collection Time: 10/11/18 12:31 PM   Specimen: Cervix  Result Value Ref  Range   Yeast Wet Prep HPF POC NONE SEEN NONE SEEN   Trich, Wet Prep NONE SEEN NONE SEEN   Clue Cells Wet Prep HPF POC NONE SEEN NONE SEEN   WBC, Wet Prep HPF POC MANY (A) NONE SEEN   Sperm NONE SEEN   CBC     Status: None   Collection Time: 10/11/18  1:23 PM  Result Value Ref Range   WBC 6.4 4.0 - 10.5 K/uL   RBC 4.50 3.87 - 5.11 MIL/uL   Hemoglobin 14.0 12.0 - 15.0 g/dL   HCT 57.841.2 46.936.0 - 62.946.0 %   MCV 91.6 80.0 - 100.0 fL   MCH 31.1 26.0 - 34.0 pg   MCHC 34.0 30.0 - 36.0 g/dL   RDW 52.811.6 41.311.5 - 24.415.5 %   Platelets 285 150 - 400 K/uL   nRBC 0.0 0.0 - 0.2 %  hCG, quantitative, pregnancy     Status: Abnormal   Collection Time: 10/11/18  1:23 PM  Result Value Ref Range   hCG, Beta Chain, Quant, S 891 (H) <5 mIU/mL  Comprehensive metabolic panel     Status: Abnormal   Collection Time: 10/11/18  1:23 PM  Result Value Ref Range   Sodium 131 (L) 135 - 145 mmol/L   Potassium 3.5 3.5 - 5.1 mmol/L   Chloride 100 98 - 111 mmol/L   CO2 22 22 - 32 mmol/L   Glucose, Bld 90 70 - 99 mg/dL   BUN 9 6 - 20 mg/dL   Creatinine, Ser 0.100.74 0.44 - 1.00 mg/dL   Calcium 8.8 (L) 8.9 - 10.3 mg/dL   Total Protein 7.0 6.5 - 8.1 g/dL   Albumin 4.2 3.5 - 5.0 g/dL   AST 18 15 - 41 U/L   ALT 24 0 - 44 U/L   Alkaline Phosphatase 59 38 - 126 U/L   Total Bilirubin 0.6 0.3 - 1.2 mg/dL   GFR calc non Af Amer >60 >60 mL/min   GFR calc Af Amer >60 >60 mL/min   Anion gap 9 5 - 15    MDM Pelvic Exam with cultures: Wet  prep and GC/CT Labs: UA, hCG, CBC, CMP TVUS MTX Injection Assessment and Plan  23 year old G5P1031 at 6 weeks by unsure LMP Abdominal Pain  -Exam findings discussed. -Informed of need to send to US immediately. -Cultures collected and sent. -Quant and CBC ordered. -Will await results.   Follow Up (2:23 PM) Right Ectopic Pregnancy  -Dr. Molli PoseyMansell call report patient with right ectopic pregnancy. -CMP added on to previous labs, quant pending.  -Dr. Debroah LoopArnold called and informed of patient status with results. Advises give MTX if less than 5k, but contact if greater. -In room to discuss POC with patient who verbalizes understanding and is appropriately tearful. -Informed that if Sharene ButtersQuant is above recommended level, could result in need for surgery and potential loss of fertility. -Question addressed regarding recurrence of ectopic pregnancy.  The risks of methotrexate were reviewed including failure requiring repeat dosing or eventual surgery. She understands that methotrexate involves frequent return visits to monitor lab values and that she remains at risk of ectopic rupture until her beta is less than assay. ?The patient opts to proceed with methotrexate.  She has no history of hepatic or renal dysfunction, has normal BUN/Cr/LFT's/platelets.  She is felt to be reliable for follow-up. Side effects of photosensitivity & GI upset were discussed.  She knows to avoid direct sunlight and abstain from alcohol, NSAIDs and sexual intercourse for  two weeks. She was counseled to discontinue any MVI with folic acid. ?She understands to follow up on D4 (Thursday) and D7 (Sunday) for repeat BHCG and was given the instruction sheet. ?Strict ectopic precautions were reviewed, the patient knows to call with any abdominal pain, vomiting, fainting, or any concerns with her health.  Day 0/1 Day 4 Day 7  Sunday Wednesday Saturday  Monday Thursday Sunday  Tuesday Friday Monday  Wednesday Saturday Tuesday   Thursday Sunday Wednesday  Friday Monday Thursday  Saturday Tuesday Friday    Follow Up (3:02 PM) -Quant returns at 821 -MTX ordered and to be administered.  Follow Up (4:22 PM) -Nurse reports completion of MTX. -Patient requesting and given pain medication. -Tylenol XR given. -Nurse instructed to educate patient regarding pain management while at home.  Follow Up (4:40 PM)  -Reiterated education regarding ectopic pregnancy, MTX treatment, and follow up. -[facility]. Scheduled for 0830am. -Encouraged to call or return to MAU if symptoms worsen or with the onset of new symptoms. -No questions or concerns.  -Discharged to home in stable condition.  Cherre RobinsJessica L Bauer Ausborn MSN, CNM 10/11/2018, 12:58 PM

## 2018-10-11 NOTE — Discharge Instructions (Signed)
Methotrexate Treatment for an Ectopic Pregnancy, Care After This sheet gives you information about how to care for yourself after your procedure. Your health care provider may also give you more specific instructions. If you have problems or questions, contact your health care provider. What can I expect after the procedure? After the procedure, it is common to have:  Abdominal cramping.  Vaginal bleeding.  Fatigue.  Nausea.  Vomiting.  Diarrhea. Blood tests will be taken at timed intervals for several days or weeks to check your pregnancy hormone levels. The blood tests will be done until the pregnancy hormone can no longer be detected in the blood. Follow these instructions at home: Activity  Do not have sex until your health care provider approves.  Limit activities that take a lot of effort as told by your health care provider. Medicines  Take over the counter and prescription medicines only as told by your health care provider.  Do not take aspirin, ibuprofen, naproxen, or any other NSAIDs.  Do not take folic acid, prenatal vitamins, or other vitamins that contain folic acid. General instructions  Methotrexate Treatment for an Ectopic Pregnancy  Methotrexate is a medicine that treats an ectopic pregnancy. An ectopic pregnancy is a pregnancy in which the fetus develops outside the uterus. This kind of pregnancy can be dangerous. Methotrexate works by stopping the growth of the fertilized egg. It also helps your body absorb tissue from the egg. This takes between 2-6 weeks. Most ectopic pregnancies can be successfully treated with methotrexate if they are diagnosed early. Tell a health care provider about:  Any allergies you have.  All medicines you are taking, including vitamins, herbs, eye drops, creams, and over-the-counter medicines.  Any medical conditions you have. What are the risks? Generally, this is a safe treatment. However, problems may occur,  including:  Nausea or vomiting or both.  Vaginal bleeding or spotting.  Diarrhea.  Abdominal cramping.  Dizziness or feeling lightheaded.  Mouth sores.  Swelling or irritation of the lining of your lungs (pneumonitis).  Liver damage.  Hair loss. There is a risk that methotrexate treatment will fail and your pregnancy will continue. There is also a risk that the ectopic pregnancy might rupture while you are using this medicine. What happens before the procedure?  Liver tests, kidney tests, and a complete blood test will be done.  Blood tests will be done to measure the pregnancy hormone levels and to determine your blood type.  If you are Rh-negative and the father is Rh-positive or his Rh type is not known, you will be given a Rho (D) immune globulin shot. What happens during the procedure? Your health care provider may give you methotrexate by injection or in the form of a pill. Methotrexate may be given as a single dose of medicine or a series of doses, depending on your response to the treatment.  Methotrexate injections will be given by your health care provider. This is the most common way that methotrexate is used to treat an ectopic pregnancy.  If you are prescribed oral methotrexate, it is very important that you follow your health care provider's instructions on how to take oral methotrexate. Additional medicines may be needed to manage an ectopic pregnancy. The procedure may vary among health care providers and hospitals. What happens after the procedure?  You may have abdominal cramping, vaginal bleeding, and fatigue.  Blood tests will be taken at timed intervals for several days or weeks to check your pregnancy hormone levels. The blood  tests will be done until the pregnancy hormone can no longer be detected in the blood.  You may need to have a surgical procedure to remove the ectopic pregnancy if methotrexate treatment fails.  Follow instructions from your  health care provider on how and when to report any symptoms that may indicate a ruptured ectopic pregnancy. Summary  Methotrexate is a medicine that treats an ectopic pregnancy.  Methotrexate may be given in a single dose or a series of doses over time.  Blood tests will be taken at timed intervals for several days or weeks to check your pregnancy hormone levels. The blood tests will be done until no more pregnancy hormone is detected in the blood.  There is a risk that methotrexate treatment will fail and your pregnancy will continue. There is also a risk that the ectopic pregnancy might rupture while you are using this medicine. This information is not intended to replace advice given to you by your health care provider. Make sure you discuss any questions you have with your health care provider. Document Released: 03/04/2001 Document Revised: 02/20/2017 Document Reviewed: 04/29/2016 Elsevier Patient Education  [place] not drink alcohol.  Follow instructions from your health care provider on how and when to report any symptoms that may indicate a ruptured ectopic pregnancy.  Keep all follow-up visits as told by your health care provider. This is important. Contact a health care provider if:  You have persistent nausea and vomiting.  You have persistent diarrhea.  You are having a reaction to the medicine, such as: ? Tiredness. ? Skin rash. ? Hair loss. Get help right away if:  Your abdominal or pelvic pain gets worse.  You have more vaginal bleeding.  You feel light-headed or you faint.  You have shortness of breath.  Your heart rate increases.  You develop a cough.  You have chills.  You have a fever. Summary  After the procedure, it is common to have symptoms of abdominal cramping, vaginal bleeding and fatigue. You may also experience other symptoms.  Blood tests will be taken at timed intervals for several days or weeks to check your pregnancy  hormone levels. The blood tests will be done until the pregnancy hormone can no longer be detected in the blood.  Limit strenuous activity as told by your health care provider.  Follow instructions from your health care provider on how and when to report any symptoms that may indicate a ruptured ectopic pregnancy. This information is not intended to replace advice given to you by your health care provider. Make sure you discuss any questions you have with your health care provider. Document Released: 02/27/2011 Document Revised: 02/20/2017 Document Reviewed: 04/29/2016 Elsevier Patient Education  2020 [employer].

## 2018-10-12 ENCOUNTER — Inpatient Hospital Stay (HOSPITAL_COMMUNITY)
Admission: AD | Admit: 2018-10-12 | Discharge: 2018-10-12 | Disposition: A | Discharge status: Home / Self Care | Payer: No Typology Code available for payment source | Attending: Obstetrics & Gynecology | Admitting: Obstetrics & Gynecology

## 2018-10-12 ENCOUNTER — Encounter (HOSPITAL_COMMUNITY): Payer: Self-pay | Admitting: *Deleted

## 2018-10-12 ENCOUNTER — Inpatient Hospital Stay (HOSPITAL_COMMUNITY): Payer: No Typology Code available for payment source

## 2018-10-12 ENCOUNTER — Other Ambulatory Visit: Payer: Self-pay

## 2018-10-12 DIAGNOSIS — F17200 Nicotine dependence, unspecified, uncomplicated: Secondary | ICD-10-CM | POA: Insufficient documentation

## 2018-10-12 DIAGNOSIS — O26891 Other specified pregnancy related conditions, first trimester: Secondary | ICD-10-CM | POA: Diagnosis not present

## 2018-10-12 DIAGNOSIS — Z79899 Other long term (current) drug therapy: Secondary | ICD-10-CM | POA: Insufficient documentation

## 2018-10-12 DIAGNOSIS — O00101 Right tubal pregnancy without intrauterine pregnancy: Secondary | ICD-10-CM | POA: Diagnosis not present

## 2018-10-12 DIAGNOSIS — Z833 Family history of diabetes mellitus: Secondary | ICD-10-CM | POA: Diagnosis not present

## 2018-10-12 DIAGNOSIS — O99331 Smoking (tobacco) complicating pregnancy, first trimester: Secondary | ICD-10-CM | POA: Insufficient documentation

## 2018-10-12 DIAGNOSIS — Z3A01 Less than 8 weeks gestation of pregnancy: Secondary | ICD-10-CM | POA: Insufficient documentation

## 2018-10-12 DIAGNOSIS — O009 Unspecified ectopic pregnancy without intrauterine pregnancy: Secondary | ICD-10-CM | POA: Diagnosis not present

## 2018-10-12 DIAGNOSIS — M549 Dorsalgia, unspecified: Secondary | ICD-10-CM | POA: Diagnosis not present

## 2018-10-12 DIAGNOSIS — O9989 Other specified diseases and conditions complicating pregnancy, childbirth and the puerperium: Secondary | ICD-10-CM | POA: Diagnosis not present

## 2018-10-12 DIAGNOSIS — M545 Low back pain, unspecified: Secondary | ICD-10-CM

## 2018-10-12 LAB — GC/CHLAMYDIA PROBE AMP (~~LOC~~) NOT AT ARMC
Chlamydia: NEGATIVE
Neisseria Gonorrhea: NEGATIVE

## 2018-10-12 MED ORDER — CYCLOBENZAPRINE HCL 10 MG PO TABS: 10.0000 mg | ORAL_TABLET | Freq: Two times a day (BID) | ORAL | 0 refills | Status: DC | PRN

## 2018-10-12 MED ORDER — CYCLOBENZAPRINE HCL 10 MG PO TABS
10.0000 mg | ORAL_TABLET | Freq: Once | ORAL | Status: AC
  Administered 2018-10-12: 10 mg via ORAL
  Filled 2018-10-12: qty 1

## 2018-10-12 NOTE — Discharge Instructions (Signed)

## 2018-10-12 NOTE — MAU Note (Signed)
Was here yesterday, was dx with an ectopic preg, was methotrexate.  Started having pain in low back this morning, seems to be getting worse. No bleeding.

## 2018-10-12 NOTE — MAU Note (Signed)
Hold flexeril per [facility] CNM

## 2018-10-12 NOTE — MAU Provider Note (Signed)
History     CSN: [id number]  Arrival date and time: 10/12/18 1551   First Provider Initiated Contact with Patient 10/12/18 1649      Chief Complaint  Patient presents with   Back Pain    Sara Rubio is a 23 y.o. [id number] at [redacted]w[redacted]d with a known and MTX treated ectopic pregnancy.  She presents today for Back Pain that started this morning.  Patient states it is an intermittent sharp shooting pain.  She further states "it is like a period cramp that makes your back hurt." Patient states she did not take anything for the pain despite noticing it around 0500.  She rates the pain a 8/10, but states "it comes and goes."  Patient denies vaginal bleeding or discharge.  She also denies shoulder or neck pain.  Patient expresses concern that her ectopic has ruptured and she will not be able to have kids in the future.      OB History    Gravida  5   Para  1   Term  1   Preterm      AB  3   Living  1     SAB      TAB  2   Ectopic  1   Multiple  0   Live Births  1        Obstetric Comments  Pregnancy #2: 8 week viable cornual ruptured ectopic pregnancy with ~500 ml of hemoperitoneum -> wedge resection Needs cesarean deliveries at 37 weeks for any subsequent pregnancies; labor is absolutely contraindicated.        Past Medical History:  Diagnosis Date   Anxiety 05/17/2017   Genital herpes    Headache    Left cornual ectopic pregnancy 05/16/2017   8 week viable cornual ruptured ectopic pregnancy with ~500 ml of hemoperitoneum -> wedge resection Needs cesarean deliveries at 37 weeks for any subsequent pregnancies; labor is absolutely contraindicated.   s/p ELAP, left cornual wedge resection & salpingectomy for ruptured cornual ectopic pregnancy on 05/16/17 05/16/2017   Seasonal allergies     Past Surgical History:  Procedure Laterality Date   LAPAROSCOPY N/A 05/16/2017   Procedure: LAPAROSCOPY DIAGNOSTIC;  Surgeon: Osborne Oman, MD;  Location: [facility];   Service: Gynecology;  Laterality: N/A;   LAPAROTOMY N/A 05/16/2017   Procedure: EXPLORATORY LAPAROTOMY, LEFT CORNUAL WEDGE RESECTION ECTOPIC PREGNANCY;  Surgeon: Osborne Oman, MD;  Location: [facility];  Service: Gynecology;  Laterality: N/A;   UNILATERAL SALPINGECTOMY Left 05/16/2017   Procedure: UNILATERAL SALPINGECTOMY;  Surgeon: Osborne Oman, MD;  Location: [facility];  Service: Gynecology;  Laterality: Left;   WISDOM TOOTH EXTRACTION      Family History  Problem Relation Age of Onset   Lupus Mother    Diabetes Father    Kidney disease Father     Social History   Tobacco Use   Smoking status: Current Some Day Smoker   Smokeless tobacco: Never Used  Substance Use Topics   Alcohol use: No   Drug use: Not Currently    Types: Marijuana    Comment: last useDec 1 2015    Allergies: No Known Allergies  Medications Prior to Admission  Medication Sig Dispense Refill Last Dose   acyclovir (ZOVIRAX) 800 MG tablet Take 1 tablet (800 mg total) by mouth 2 (two) times daily. For 5 days for any outbreak. 10 tablet 11    hydrOXYzine (VISTARIL) 25 MG capsule Take 25 mg by mouth 3 (three) times  daily as needed.       Review of Systems  Constitutional: Negative for chills and fever.  Respiratory: Negative for chest tightness and shortness of breath.   Cardiovascular: Negative for chest pain.  Gastrointestinal: Positive for abdominal pain. Negative for nausea and vomiting.  Genitourinary: Negative for difficulty urinating, dysuria, vaginal bleeding and vaginal discharge.  Musculoskeletal: Positive for back pain. Gait problem: Right Side.  Neurological: Positive for headaches.   Physical Exam   Blood pressure 129/77, pulse 92, temperature 98.5 F (36.9 C), temperature source Oral, resp. rate 16, height 5\' 8"  (1.727 m), weight 91 kg, last menstrual period 08/30/2018, SpO2 98 %.  Physical Exam  Constitutional: She is oriented to person, place, and time. She appears  well-developed and well-nourished.  HENT:  Head: Normocephalic and atraumatic.  Eyes: Conjunctivae are normal.  Neck: Normal range of motion.  Cardiovascular: Normal rate, regular rhythm and normal heart sounds.  Respiratory: Effort normal and breath sounds normal.  GI: Soft. Bowel sounds are normal. There is abdominal tenderness (With deep palpation) in the right lower quadrant.  Musculoskeletal: Normal range of motion.       Back:  Neurological: She is alert and oriented to person, place, and time.  Skin: Skin is warm and dry.  Psychiatric: She has a normal mood and affect. Her behavior is normal.    MAU Course  Procedures [place] [facility] Transvaginal  Result Date: 10/12/2018 CLINICAL DATA:  23 year old who was diagnosed with a RIGHT adnexal ectopic pregnancy yesterday and was given methotrexate. She presents today with RIGHT-sided back pain. LMP 08/30/2018 (6 weeks 1 day). Quantitative beta hCG measured 891 yesterday. Personal history of LEFT salpingectomy in 2019 for an ectopic pregnancy. EXAM: TRANSVAGINAL OB < 14 WEEKS ULTRASOUND TECHNIQUE: Transvaginal ultrasound was performed for complete evaluation of the gestation as well as the maternal uterus, adnexal regions, and pelvic cul-de-sac. COMPARISON:  Early OB ultrasound yesterday. FINDINGS: Intrauterine gestational sac: Absent. Yolk sac:  Absent. Embryo:  Absent. Cardiac Activity: Absent. US EDC: Not applicable. Subchorionic hemorrhage:  Absent. Maternal uterus/adnexae: Normal appearing nongravid uterus. Approximate 8 mm mass in the RIGHT adnexum adjacent to the RIGHT ovary with the appearance of a gestational sac, unchanged since yesterday's examination. Normal-appearing RIGHT ovary measuring approximately 2.5 x 3.3 x 2.2 cm. Normal-appearing LEFT ovary measuring approximately 1.8 x 2.7 x 1.5 cm. No free pelvic fluid. IMPRESSION: 1. No change since yesterday in the appearance of the RIGHT adnexal ectopic. 2. No free pelvic fluid or blood to suggest  rupture. 3. Normal-appearing uterus and ovaries. Electronically Signed   By: Hulan Saashomas  Lawrence M.D.   On: 10/12/2018 18:27    MDM Physical Exam TVUS Muscle Relaxant Warm Compress  Assessment and Plan  23 year old  [id number] at 6.1weeks Ectopic Pregnancy New Onset Back Pain  -Exam findings discussed -Offered and accepts pain medication. -Will give Flexeril 10mg  now -Will apply warm compress to area. -Consult with Ivonne AndrewV. Smith, CNM who agrees with sending for repeat US to r/o ruptured ectopic in setting of new symptom onset. -Will await results.   Follow Up (6:45 PM) Right Ectopic-Stable  -Patient reports improvement of symptoms slightly with warm compress and medications. -Requests prescription for flexeril-sent. -US results discussed and patient informed that ectopic stable with no signs of rupture.  -No questions or concerns. -Patient requests work note. Nurse instructed to provide. -Encouraged to call or return to MAU if symptoms worsen or with the onset of new symptoms. -[facility]. -Discharged to home in stable condition.  Cherre RobinsJessica L Mercer Stallworth MSN, CNM 10/12/2018, 4:49 PM

## 2018-10-13 ENCOUNTER — Telehealth: Payer: Self-pay | Admitting: Family Medicine

## 2018-10-13 NOTE — Telephone Encounter (Signed)
Spoke to patient about her appointment on 7/23 @ 8:30. Patient instructed to wear a face mask for the entire appointment and no visitors are allowed with her during the visit. Patient screened for covid symptoms and denied having any

## 2018-10-14 ENCOUNTER — Other Ambulatory Visit: Payer: Self-pay

## 2018-10-14 ENCOUNTER — Ambulatory Visit (INDEPENDENT_AMBULATORY_CARE_PROVIDER_SITE_OTHER): Payer: No Typology Code available for payment source | Admitting: General Practice

## 2018-10-14 ENCOUNTER — Telehealth: Payer: Self-pay | Admitting: General Practice

## 2018-10-14 DIAGNOSIS — Z9889 Other specified postprocedural states: Secondary | ICD-10-CM

## 2018-10-14 DIAGNOSIS — O00101 Right tubal pregnancy without intrauterine pregnancy: Secondary | ICD-10-CM

## 2018-10-14 LAB — BETA HCG QUANT (REF LAB): hCG Quant: 1101 m[IU]/mL

## 2018-10-14 NOTE — Progress Notes (Signed)
Patient presents to office today for stat bhcg day #4 labs following MTX 7/20. Patient reports some mild back pain and denies bleeding. Discussed with patient we are monitoring your bhcg levels today, results/history will be reviewed with a provider in the office, & we will call her in a couple hours with results/updated plan of care. Patient verbalized understanding & provided call back number [phone].  Reviewed results with Dr Hulan Fray who finds slight increase in bhcg levels, patient should follow up in MAU on Sunday for day #7 labs. Will call patient with results.  Koren Bound RN BSN 10/14/18

## 2018-10-14 NOTE — Telephone Encounter (Signed)
Called patient and informed her of bhcg results. Discussed levels can go up some on day #4, that isn't uncommon. Discussed importance of following up in MAU on Sunday for Day #7 labs in which bhcg levels should be decreasing. Ectopic precautions reviewed with patient. Patient verbalized understanding to all & had no questions.

## 2018-10-18 NOTE — Progress Notes (Signed)
I have reviewed this chart and agree with the RN/CMA assessment and management.    Meron Bocchino C Lysander Calixte, MD, FACOG Attending Physician, Faculty Practice [facility]  

## 2018-10-19 ENCOUNTER — Telehealth: Payer: Self-pay | Admitting: General Practice

## 2018-10-19 ENCOUNTER — Telehealth: Payer: Self-pay

## 2018-10-19 DIAGNOSIS — O009 Unspecified ectopic pregnancy without intrauterine pregnancy: Secondary | ICD-10-CM

## 2018-10-19 NOTE — Telephone Encounter (Signed)
Pt called thru Nurse line at 11:15am, regarding being treated at [facility] for an Ectopic Pregnancy, would like a call back [phone]

## 2018-10-19 NOTE — Telephone Encounter (Signed)
Per review, patient never returned to the hospital for day #7 labs. Called patient, no answer- left message stating we are calling because she never followed up over the weekend. Please go in now. You may call back if you have questions.

## 2018-10-20 NOTE — Telephone Encounter (Signed)
Called pt and pt asked if she is suppose to have bleeding after MTX shot.  I explained to the pt that it is normal to have bleeding.  I advised pt that if she starts saturating a pad an hour to please go to MAU or if she starts having intense pain.  Pt verbalized understanding with no further questions.

## 2018-10-22 NOTE — Progress Notes (Signed)
I have reviewed this chart and agree with the RN/CMA assessment and management.    Alexandra Posadas C Anwen Cannedy, MD, FACOG Attending Physician, Faculty Practice [facility]  

## 2018-11-30 ENCOUNTER — Ambulatory Visit (HOSPITAL_COMMUNITY)
Admission: EM | Admit: 2018-11-30 | Discharge: 2018-11-30 | Disposition: A | Discharge status: Home / Self Care | Payer: No Typology Code available for payment source | Attending: Urgent Care | Admitting: Urgent Care

## 2018-11-30 ENCOUNTER — Encounter (HOSPITAL_COMMUNITY): Payer: Self-pay

## 2018-11-30 ENCOUNTER — Other Ambulatory Visit: Payer: Self-pay

## 2018-11-30 DIAGNOSIS — Z7251 High risk heterosexual behavior: Secondary | ICD-10-CM | POA: Insufficient documentation

## 2018-11-30 DIAGNOSIS — A6000 Herpesviral infection of urogenital system, unspecified: Secondary | ICD-10-CM | POA: Insufficient documentation

## 2018-11-30 MED ORDER — VALACYCLOVIR HCL 1 G PO TABS: ORAL_TABLET | ORAL | 0 refills | Status: DC

## 2018-11-30 NOTE — ED Provider Notes (Signed)
MRN: [id number] DOB: Dec 28, 1995  Subjective:   Sara Rubio is a 23 y.o. female presenting for medication refill of Valtrex for genital herpes.  Patient also has unprotected sex, has a new partner and would like to be checked for STIs.  She denies any active symptoms currently.  No current facility-administered medications for this encounter.   Current Outpatient Medications:  .  acyclovir (ZOVIRAX) 800 MG tablet, Take 1 tablet (800 mg total) by mouth 2 (two) times daily. For 5 days for any outbreak., Disp: 10 tablet, Rfl: 11 .  cyclobenzaprine (FLEXERIL) 10 MG tablet, Take 1 tablet (10 mg total) by mouth 2 (two) times daily as needed for muscle spasms., Disp: 10 tablet, Rfl: 0 .  hydrOXYzine (VISTARIL) 25 MG capsule, Take 25 mg by mouth 3 (three) times daily as needed., Disp: , Rfl:    No Known Allergies  Past Medical History:  Diagnosis Date  . Anxiety 05/17/2017  . Genital herpes   . Headache   . Left cornual ectopic pregnancy 05/16/2017   8 week viable cornual ruptured ectopic pregnancy with ~500 ml of hemoperitoneum -> wedge resection Needs cesarean deliveries at 37 weeks for any subsequent pregnancies; labor is absolutely contraindicated.  . s/p ELAP, left cornual wedge resection & salpingectomy for ruptured cornual ectopic pregnancy on 05/16/17 05/16/2017  . Seasonal allergies      Past Surgical History:  Procedure Laterality Date  . LAPAROSCOPY N/A 05/16/2017   Procedure: LAPAROSCOPY DIAGNOSTIC;  Surgeon: Osborne Oman, MD;  Location: [facility];  Service: Gynecology;  Laterality: N/A;  . LAPAROTOMY N/A 05/16/2017   Procedure: EXPLORATORY LAPAROTOMY, LEFT CORNUAL WEDGE RESECTION ECTOPIC PREGNANCY;  Surgeon: Osborne Oman, MD;  Location: [facility];  Service: Gynecology;  Laterality: N/A;  . UNILATERAL SALPINGECTOMY Left 05/16/2017   Procedure: UNILATERAL SALPINGECTOMY;  Surgeon: Osborne Oman, MD;  Location: [facility];  Service: Gynecology;  Laterality: Left;  . WISDOM TOOTH  EXTRACTION      ROS   Objective:   Vitals: BP 120/68 (BP Location: Right Arm)   Pulse 68   Temp 98.3 F (36.8 C) (Oral)   Resp 18   Wt 201 lb (91.2 kg)   LMP 11/20/2018   SpO2 100%   Breastfeeding Unknown   BMI 30.56 kg/m   Physical Exam Constitutional:      General: She is not in acute distress.    Appearance: Normal appearance. She is well-developed. She is not ill-appearing.  HENT:     Head: Normocephalic and atraumatic.     Nose: Nose normal.     Mouth/Throat:     Mouth: Mucous membranes are moist.     Pharynx: Oropharynx is clear.  Eyes:     General: No scleral icterus.    Extraocular Movements: Extraocular movements intact.     Pupils: Pupils are equal, round, and reactive to light.  Cardiovascular:     Rate and Rhythm: Normal rate.  Pulmonary:     Effort: Pulmonary effort is normal.  Skin:    General: Skin is warm and dry.  Neurological:     General: No focal deficit present.     Mental Status: She is alert and oriented to person, place, and time.  Psychiatric:        Mood and Affect: Mood normal.        Behavior: Behavior normal.      Assessment and Plan :   1. Genital herpes simplex, unspecified site   2. Unprotected sex  STI testing pending.  Patient provided with refill for Valtrex, counseled on dosing instructions for recurrent outbreaks of genital herpes. Counseled patient on potential for adverse effects with medications prescribed/recommended today, ER and return-to-clinic precautions discussed, patient verbalized understanding.    Wallis BambergMani, Sanchez Hemmer, PA-C 11/30/18 1510

## 2018-11-30 NOTE — ED Triage Notes (Signed)
Pt states she wants to get tested for STDs because she has a change in partners.

## 2018-12-02 ENCOUNTER — Telehealth (HOSPITAL_COMMUNITY): Payer: Self-pay | Admitting: Emergency Medicine

## 2018-12-02 LAB — CERVICOVAGINAL ANCILLARY ONLY
Bacterial vaginitis: NEGATIVE
Candida vaginitis: POSITIVE — AB
Chlamydia: NEGATIVE
Neisseria Gonorrhea: NEGATIVE
Trichomonas: NEGATIVE

## 2018-12-02 MED ORDER — FLUCONAZOLE 150 MG PO TABS: 150.0000 mg | ORAL_TABLET | Freq: Once | ORAL | 0 refills | Status: AC

## 2018-12-02 NOTE — Telephone Encounter (Signed)
Test for candida (yeast) was positive.  Prescription for fluconazole 150mg po now, repeat dose in 3d if needed, #2 no refills, sent to the pharmacy of record.  Recheck or followup with PCP for further evaluation if symptoms are not improving.    Patient contacted and made aware of    results, all questions answered   

## 2019-01-03 ENCOUNTER — Ambulatory Visit (INDEPENDENT_AMBULATORY_CARE_PROVIDER_SITE_OTHER): Payer: No Typology Code available for payment source

## 2019-01-03 ENCOUNTER — Other Ambulatory Visit: Payer: Self-pay

## 2019-01-03 ENCOUNTER — Ambulatory Visit (HOSPITAL_COMMUNITY)
Admission: EM | Admit: 2019-01-03 | Discharge: 2019-01-03 | Disposition: A | Discharge status: Home / Self Care | Payer: No Typology Code available for payment source | Attending: Family Medicine | Admitting: Family Medicine

## 2019-01-03 ENCOUNTER — Encounter (HOSPITAL_COMMUNITY): Payer: Self-pay

## 2019-01-03 DIAGNOSIS — M25561 Pain in right knee: Secondary | ICD-10-CM

## 2019-01-03 DIAGNOSIS — M25461 Effusion, right knee: Secondary | ICD-10-CM

## 2019-01-03 MED ORDER — NAPROXEN 500 MG PO TABS: 500.0000 mg | ORAL_TABLET | Freq: Two times a day (BID) | ORAL | 0 refills | Status: DC

## 2019-01-03 NOTE — ED Triage Notes (Signed)
Pt presents to UC w/ c/o right knee pain since this morning. Pt reports being in bed and waking up with it hurting. Pt believes it may be dislocated.

## 2019-01-03 NOTE — ED Provider Notes (Signed)
[facility]    CSN: [id number] Arrival date & time: 01/03/19  1633      History   Chief Complaint Chief Complaint  Patient presents with  . right knee pain    HPI Sara Rubio is a 23 y.o. female.   Sara Rubio presents with complaints of right knee pain which she woke with at 0400 this am. States she has had "problems with it popping out" and it "feels dislocated." states she feels she has had issues for some time now. No specific known injury to the knee. She has been working from home. Swelling. Pain. Pain with weight bearing as well as pain with flexion and extension. No numbness tingling or weakness to lower leg. Hasn't taken any medications for pain today. Denies any previous knee surgeries.    ROS per HPI, negative if not otherwise mentioned.      Past Medical History:  Diagnosis Date  . Anxiety 05/17/2017  . Genital herpes   . Headache   . Left cornual ectopic pregnancy 05/16/2017   8 week viable cornual ruptured ectopic pregnancy with ~500 ml of hemoperitoneum -> wedge resection Needs cesarean deliveries at 37 weeks for any subsequent pregnancies; labor is absolutely contraindicated.  . s/p ELAP, left cornual wedge resection & salpingectomy for ruptured cornual ectopic pregnancy on 05/16/17 05/16/2017  . Seasonal allergies     Patient Active Problem List   Diagnosis Date Noted  . Anxiety 05/17/2017  . s/p ELAP, left cornual wedge resection & salpingectomy for ruptured cornual ectopic pregnancy on 05/16/17 05/16/2017  . Genital herpes     Past Surgical History:  Procedure Laterality Date  . LAPAROSCOPY N/A 05/16/2017   Procedure: LAPAROSCOPY DIAGNOSTIC;  Surgeon: Osborne Oman, MD;  Location: [facility];  Service: Gynecology;  Laterality: N/A;  . LAPAROTOMY N/A 05/16/2017   Procedure: EXPLORATORY LAPAROTOMY, LEFT CORNUAL WEDGE RESECTION ECTOPIC PREGNANCY;  Surgeon: Osborne Oman, MD;  Location: [facility];  Service: Gynecology;  Laterality: N/A;   . UNILATERAL SALPINGECTOMY Left 05/16/2017   Procedure: UNILATERAL SALPINGECTOMY;  Surgeon: Osborne Oman, MD;  Location: [facility];  Service: Gynecology;  Laterality: Left;  . WISDOM TOOTH EXTRACTION      OB History    Gravida  5   Para  1   Term  1   Preterm      AB  3   Living  1     SAB      TAB  2   Ectopic  1   Multiple  0   Live Births  1        Obstetric Comments  Pregnancy #2: 8 week viable cornual ruptured ectopic pregnancy with ~500 ml of hemoperitoneum -> wedge resection Needs cesarean deliveries at 37 weeks for any subsequent pregnancies; labor is absolutely contraindicated.         Home Medications    Prior to Admission medications   Medication Sig Start Date End Date Taking? Authorizing Provider  cyclobenzaprine (FLEXERIL) 10 MG tablet Take 1 tablet (10 mg total) by mouth 2 (two) times daily as needed for muscle spasms. 10/12/18   Gavin Pound, CNM  hydrOXYzine (VISTARIL) 25 MG capsule Take 25 mg by mouth 3 (three) times daily as needed.    [provider]  naproxen (NAPROSYN) 500 MG tablet Take 1 tablet (500 mg total) by mouth 2 (two) times daily. 01/03/19   Zigmund Gottron, NP  valACYclovir (VALTREX) 1000 MG tablet At the start of an  outbreak, take 1 tablet daily for 5 days. 11/30/18   Wallis BambergMani, Mario, PA-C    Family History Family History  Problem Relation Age of Onset  . Lupus Mother   . Diabetes Father   . Kidney disease Father     Social History Social History   Tobacco Use  . Smoking status: Current Some Day Smoker  . Smokeless tobacco: Never Used  Substance Use Topics  . Alcohol use: No  . Drug use: Not Currently    Types: Marijuana    Comment: last useDec 1 2015     Allergies   Patient has no known allergies.   Review of Systems Review of Systems   Physical Exam Triage Vital Signs ED Triage Vitals  Enc Vitals Group     BP 01/03/19 1721 (!) 141/84     Pulse Rate 01/03/19 1721 86     Resp 01/03/19 1721 16      Temp 01/03/19 1721 98.3 F (36.8 C)     Temp Source 01/03/19 1721 Tympanic     SpO2 01/03/19 1721 100 %     Weight --      Height --      Head Circumference --      Peak Flow --      Pain Score 01/03/19 1723 9     Pain Loc --      Pain Edu? --      Excl. in GC? --    No data found.  Updated Vital Signs BP (!) 141/84 (BP Location: Right Arm)   Pulse 86   Temp 98.3 F (36.8 C) (Tympanic)   Resp 16   LMP 12/21/2018   SpO2 100%   Visual Acuity Right Eye Distance:   Left Eye Distance:   Bilateral Distance:    Right Eye Near:   Left Eye Near:    Bilateral Near:     Physical Exam Constitutional:      General: She is not in acute distress.    Appearance: She is well-developed.  Cardiovascular:     Rate and Rhythm: Normal rate.  Pulmonary:     Effort: Pulmonary effort is normal.  Musculoskeletal:     Right knee: She exhibits decreased range of motion, swelling and effusion. She exhibits no ecchymosis, no deformity, no laceration, no erythema, normal alignment and no bony tenderness. Tenderness found. Lateral joint line and LCL tenderness noted.     Comments: No obvious laxity to knee but significant pain with flexion and extension as well as swelling, primarily to lateral aspect soft tissue space of joint; strength equal bilaterally; gross sensation intact; strong pedal pulse to right foot   Skin:    General: Skin is warm and dry.  Neurological:     Mental Status: She is alert and oriented to person, place, and time.      UC Treatments / Results  Labs (all labs ordered are listed, but only abnormal results are displayed) Labs Reviewed - No data to display  EKG   Radiology Dg Knee Complete 4 Views Right  Result Date: 01/03/2019 CLINICAL DATA:  23 year old female with lateral pain and swelling. No known injury. EXAM: RIGHT KNEE - COMPLETE 4+ VIEW COMPARISON:  None. FINDINGS: Bone mineralization is within normal limits. Normal joint spaces and alignment. No  joint effusion identified. Patella intact. Along the anterior aspect of the lateral joint space there is a small 7-8 millimeter rim calcified object. The adjacent articular surfaces appear normal. No acute osseous abnormality identified. IMPRESSION:  1. Small 7-8 mm rim calcified loose body suspected within the anterior aspect of the lateral joint space. 2. Otherwise negative radiographic appearance of the right knee. Electronically Signed   By: Odessa Fleming M.D.   On: 01/03/2019 19:07    Procedures Procedures (including critical care time)  Medications Ordered in UC Medications - No data to display  Initial Impression / Assessment and Plan / UC Course  I have reviewed the triage vital signs and the nursing notes.  Pertinent labs & imaging results that were available during my care of the patient were reviewed by me and considered in my medical decision making (see chart for details).     No acute findings on knee xray, no traumatic or known injury to knee. Effusion and pain present. Ice, elevation, ace, nsaids for pain and swelling. Encouraged follow up with sports medicine and/or orthopedics for further evaluation. Crutches provided. Patient verbalized understanding and agreeable to plan.   Final Clinical Impressions(s) / UC Diagnoses   Final diagnoses:  Acute pain of right knee  Knee effusion, right     Discharge Instructions     Your xray does not show any indications of sources of your current symptoms.  You clearly have swelling to the knee and pain. This is something which isn't visible on xray.  We will wrap with an ace for compression and some support, and use of crutches to help with pain.  Naproxen twice a day, take with food.  Please follow up with orthopedics for further evaluation and management.     ED Prescriptions    Medication Sig Dispense Auth. Provider   naproxen (NAPROSYN) 500 MG tablet Take 1 tablet (500 mg total) by mouth 2 (two) times daily. 30 tablet Georgetta Haber, NP     PDMP not reviewed this encounter.   Georgetta Haber, NP 01/03/19 2018

## 2019-01-03 NOTE — Discharge Instructions (Addendum)
Your xray does not show any indications of sources of your current symptoms.  You clearly have swelling to the knee and pain. This is something which isn't visible on xray.  We will wrap with an ace for compression and some support, and use of crutches to help with pain.  Naproxen twice a day, take with food.  Please follow up with orthopedics for further evaluation and management.

## 2019-01-06 ENCOUNTER — Ambulatory Visit (INDEPENDENT_AMBULATORY_CARE_PROVIDER_SITE_OTHER): Payer: No Typology Code available for payment source | Admitting: Family Medicine

## 2019-01-06 ENCOUNTER — Other Ambulatory Visit: Payer: Self-pay

## 2019-01-06 VITALS — BP 106/72 | Ht 68.0 in | Wt 199.0 lb

## 2019-01-06 DIAGNOSIS — M25561 Pain in right knee: Secondary | ICD-10-CM | POA: Diagnosis not present

## 2019-01-06 MED ORDER — HYDROCODONE-ACETAMINOPHEN 5-325 MG PO TABS: 1.0000 | ORAL_TABLET | Freq: Four times a day (QID) | ORAL | 0 refills | Status: DC | PRN

## 2019-01-06 MED ORDER — MELOXICAM 15 MG PO TABS: 15.0000 mg | ORAL_TABLET | Freq: Every day | ORAL | 2 refills | Status: DC
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
## 2019-01-06 NOTE — Patient Instructions (Signed)
I'm concerned about a loose body of your knee causing it to lock and catch on you. We will go ahead with an MRI to further assess. Follow up with me the day after you have the MRI for a no charge visit to go over results and next steps. Meloxicam 15mg  daily with food for pain and inflammation - stop the naproxen. Norco as needed for severe pain (no driving on this medication). Icing 15 minutes at a time 3-4 times a day. Wear knee brace when up and walking around. Crutch for support.
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
## 2019-01-06 NOTE — Progress Notes (Deleted)
Spoke with [employer]. No auth required for her MRI. Reference for the call is Celso Amy. 01/06/2019.

## 2019-01-07 ENCOUNTER — Encounter: Payer: Self-pay | Admitting: Family Medicine

## 2019-01-25 ENCOUNTER — Other Ambulatory Visit: Payer: No Typology Code available for payment source

## 2019-01-29 ENCOUNTER — Telehealth: Payer: No Typology Code available for payment source

## 2019-02-01 ENCOUNTER — Other Ambulatory Visit: Payer: Self-pay

## 2019-02-01 ENCOUNTER — Ambulatory Visit (HOSPITAL_COMMUNITY)
Admission: EM | Admit: 2019-02-01 | Discharge: 2019-02-01 | Disposition: A | Discharge status: Home / Self Care | Payer: No Typology Code available for payment source | Attending: Family Medicine | Admitting: Family Medicine

## 2019-02-01 ENCOUNTER — Encounter (HOSPITAL_COMMUNITY): Payer: Self-pay

## 2019-02-01 DIAGNOSIS — F419 Anxiety disorder, unspecified: Secondary | ICD-10-CM

## 2019-02-01 MED ORDER — ESCITALOPRAM OXALATE 10 MG PO TABS: ORAL_TABLET | ORAL | 0 refills | Status: DC

## 2019-02-01 MED ORDER — ALPRAZOLAM 0.5 MG PO TABS: 0.5000 mg | ORAL_TABLET | Freq: Two times a day (BID) | ORAL | 0 refills | Status: DC | PRN

## 2019-02-01 NOTE — ED Provider Notes (Signed)
[facility]    CSN: [id number] Arrival date & time: 02/01/19  [id number]      History   Chief Complaint Chief Complaint  Patient presents with  . Appointment    8:30  . Rash    HPI Sara Rubio is a 23 y.o. female.   HPI  The "rash" patient scheduled an appointment for is a spot on her tongue. Painless.  Present for a couple of weeks.  [facility] and was given nystatin.  She thinks this has become smaller.  She has been on google and wants reassurance she does not have HIV. As I speak to her she breaks into tears.  She tells me that she is a history of anxiety.  She is going through difficult time.  She states she hates to be left alone.  She cries almost every day.  She states "I do not know what is wrong with me".  Her family doctor gave her hydroxyzine for this in the past.  It was not particularly helpful.  She is never seen a Veterinary surgeoncounselor.,  Been treated for depression, or taken an SSRI.  She states that she is having difficulty sleeping.  Has difficulty focusing on her job.  Cries easily.  Boyfriend is very supportive.  She has a 23-year-old daughter, and he is to send her to school every day because she does not want to be alone.  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  She has thoughts of harming herself, but insists that she would never actually do this because of her daughter.  She does feel like people would be better off without her.  No plan.  Past Medical History:  Diagnosis Date  . Anxiety 05/17/2017  . Genital herpes   . Headache   . Left cornual ectopic pregnancy 05/16/2017   8 week viable cornual ruptured ectopic pregnancy with ~500 ml of hemoperitoneum -> wedge resection Needs cesarean deliveries at 37 weeks for any subsequent pregnancies; labor is absolutely contraindicated.  . s/p ELAP, left cornual wedge resection & salpingectomy for ruptured cornual ectopic pregnancy on 05/16/17  05/16/2017  . Seasonal allergies     Patient Active Problem List   Diagnosis Date Noted  . Acute pain of right knee 01/06/2019  . Anxiety 05/17/2017  . s/p ELAP, left cornual wedge resection & salpingectomy for ruptured cornual ectopic pregnancy on 05/16/17 05/16/2017  . Genital herpes     Past Surgical History:  Procedure Laterality Date  . LAPAROSCOPY N/A 05/16/2017   Procedure: LAPAROSCOPY DIAGNOSTIC;  Surgeon: Tereso NewcomerAnyanwu, Ugonna A, MD;  Location: [facility];  Service: Gynecology;  Laterality: N/A;  . LAPAROTOMY N/A 05/16/2017   Procedure: EXPLORATORY LAPAROTOMY, LEFT CORNUAL WEDGE RESECTION ECTOPIC PREGNANCY;  Surgeon: Tereso NewcomerAnyanwu, Ugonna A, MD;  Location: [facility];  Service: Gynecology;  Laterality: N/A;  . UNILATERAL SALPINGECTOMY Left 05/16/2017   Procedure: UNILATERAL SALPINGECTOMY;  Surgeon: Tereso NewcomerAnyanwu, Ugonna A, MD;  Location: [facility];  Service: Gynecology;  Laterality: Left;  . WISDOM TOOTH EXTRACTION      OB History    Gravida  5   Para  1   Term  1   Preterm      AB  3   Living  1     SAB      TAB  2   Ectopic  1   Multiple  0   Live Births  1  Obstetric Comments  Pregnancy #2: 8 week viable cornual ruptured ectopic pregnancy with ~500 ml of hemoperitoneum -> wedge resection Needs cesarean deliveries at 37 weeks for any subsequent pregnancies; labor is absolutely contraindicated.         Home Medications    Prior to Admission medications   Medication Sig Start Date End Date Taking? Authorizing Provider  ALPRAZolam Duanne Moron) 0.5 MG tablet Take 1 tablet (0.5 mg total) by mouth 2 (two) times daily as needed for anxiety. 02/01/19   Raylene Everts, MD  escitalopram (LEXAPRO) 10 MG tablet Take one a day for one week then 2 a day 02/01/19   Raylene Everts, MD  hydrOXYzine (VISTARIL) 25 MG capsule Take 25 mg by mouth 3 (three) times daily as needed.    [provider]  valACYclovir (VALTREX) 1000 MG tablet At the start of an outbreak, take 1 tablet  daily for 5 days. 11/30/18   Jaynee Eagles, PA-C    Family History Family History  Problem Relation Age of Onset  . Lupus Mother   . Diabetes Father   . Kidney disease Father     Social History Social History   Tobacco Use  . Smoking status: Current Some Day Smoker    Packs/day: 0.30    Years: 5.00    Pack years: 1.50    Types: Cigarettes  . Smokeless tobacco: Never Used  Substance Use Topics  . Alcohol use: Yes    Comment: weekends  . Drug use: Not Currently    Types: Marijuana    Comment: last useDec 1 2015     Allergies   Patient has no known allergies.   Review of Systems Review of Systems  Constitutional: Negative for chills and fever.  HENT: Negative for ear pain and sore throat.   Eyes: Negative for pain and visual disturbance.  Respiratory: Negative for cough and shortness of breath.   Cardiovascular: Negative for chest pain and palpitations.  Gastrointestinal: Negative for abdominal pain and vomiting.  Genitourinary: Negative for dysuria and hematuria.  Musculoskeletal: Negative for arthralgias and back pain.  Skin: Negative for color change and rash.  Neurological: Negative for seizures and syncope.  Psychiatric/Behavioral: Positive for dysphoric mood and suicidal ideas. The patient is nervous/anxious.   All other systems reviewed and are negative.    Physical Exam Triage Vital Signs ED Triage Vitals  Enc Vitals Group     BP 02/01/19 0857 139/76     Pulse Rate 02/01/19 0857 83     Resp 02/01/19 0857 16     Temp 02/01/19 0857 98.4 F (36.9 C)     Temp Source 02/01/19 0857 Oral     SpO2 02/01/19 0857 99 %   No data found.  Updated Vital Signs BP 139/76 (BP Location: Right Arm)   Pulse 83   Temp 98.4 F (36.9 C) (Oral)   Resp 16   LMP 08/30/2018   SpO2 99%   Breastfeeding No      Physical Exam Constitutional:      General: She is in acute distress.     Appearance: She is well-developed and normal weight.  HENT:     Head:  Normocephalic and atraumatic.     Mouth/Throat:     Comments: Tongue has a minute, 1-2 mm Tiegs papule in middle.  Non tender. Eyes:     Conjunctiva/sclera: Conjunctivae normal.     Pupils: Pupils are equal, round, and reactive to light.  Neck:     Musculoskeletal: Normal  range of motion.  Cardiovascular:     Rate and Rhythm: Normal rate.  Pulmonary:     Effort: Pulmonary effort is normal. No respiratory distress.  Abdominal:     General: There is no distension.     Palpations: Abdomen is soft.  Musculoskeletal: Normal range of motion.  Skin:    General: Skin is warm and dry.  Neurological:     General: No focal deficit present.     Mental Status: She is alert.  Psychiatric:        Attention and Perception: Attention normal.        Mood and Affect: Mood is anxious. Affect is labile and tearful.        Behavior: Behavior normal.        Thought Content: Thought content normal.        Cognition and Memory: Cognition normal.        Judgment: Judgment normal.      UC Treatments / Results  Labs (all labs ordered are listed, but only abnormal results are displayed) Labs Reviewed - No data to display  EKG   Radiology No results found.  Procedures Procedures (including critical care time)  Medications Ordered in UC Medications - No data to display  Initial Impression / Assessment and Plan / UC Course  I have reviewed the triage vital signs and the nursing notes.  Pertinent labs & imaging results that were available during my care of the patient were reviewed by me and considered in my medical decision making (see chart for details).     I told patient that I cannot explain what this tiny bump is on her tongue.  I can reassure her that it is not cancer, its not infection, and it is not HIV.  I think she is focused on this and worried about this because of her anxiety disorder. For her anxiety disorder she needs to be on an SSRI medication.  I think this will give her  back control of her moods and lability.  Giving her a limited number of alprazolam.  I explained to her why this is not a good medicine to take daily and that it will not be refilled. She has an appointment with the PCP for early December.  She is encouraged to keep this appointment. Final Clinical Impressions(s) / UC Diagnoses   Final diagnoses:  Anxiety     Discharge Instructions     Take the Lexapro 1 a day.  After 1 week increase to 2 a day. This is the medicine that will help the most.  You will need to take it for 3 to 4 weeks to see a real benefit.  Keep taking it unless you have significant side effects.  Call for problems Take alprazolam as needed for severe anxiety.  Try to resist taking it daily.  Will not be refilled.  It should help your fear of being alone. See your PCP in December Do not worry about the spot on your tongue   ED Prescriptions    Medication Sig Dispense Auth. Provider   escitalopram (LEXAPRO) 10 MG tablet Take one a day for one week then 2 a day 60 tablet Eustace Moore, MD   ALPRAZolam Prudy Feeler) 0.5 MG tablet Take 1 tablet (0.5 mg total) by mouth 2 (two) times daily as needed for anxiety. 20 tablet Eustace Moore, MD     I have reviewed the PDMP during this encounter.   Eustace Moore, MD 02/01/19 1240

## 2019-02-01 NOTE — ED Triage Notes (Signed)
Patient presents to Urgent Care with complaints of two small bumps on the middle of her tongue towards the back since last week. Patient reports she got nystatin from the [facility] and that has been helping but the pt would like to know if we can tell her the cause.

## 2019-02-01 NOTE — Discharge Instructions (Addendum)
Take the Lexapro 1 a day.  After 1 week increase to 2 a day. This is the medicine that will help the most.  You will need to take it for 3 to 4 weeks to see a real benefit.  Keep taking it unless you have significant side effects.  Call for problems Take alprazolam as needed for severe anxiety.  Try to resist taking it daily.  Will not be refilled.  It should help your fear of being alone. See your PCP in December Do not worry about the spot on your tongue

## 2019-02-02 ENCOUNTER — Other Ambulatory Visit: Payer: No Typology Code available for payment source

## 2019-02-19 ENCOUNTER — Inpatient Hospital Stay: Admission: RE | Admit: 2019-02-19 | Payer: No Typology Code available for payment source | Source: Ambulatory Visit

## 2019-03-04 IMAGING — US US OB COMP LESS 14 WK
1 series · 15 of 21 positions shown · non-contrast
Comparison: None.

CLINICAL DATA: 21-year-old female with positive pregnancy test and
severe pelvic pain.

EXAM:
OBSTETRIC <14 WK US AND TRANSVAGINAL OB US
TECHNIQUE: Both transabdominal and transvaginal ultrasound examinations were
performed for complete evaluation of the gestation as well as the
maternal uterus, adnexal regions, and pelvic cul-de-sac.
Transvaginal technique was performed to assess early pregnancy.
The study is limited due to patient extreme pain..

[Series 1: us ob comp less 14 wk · 15 of 21 slices shown]
[im 1/21]
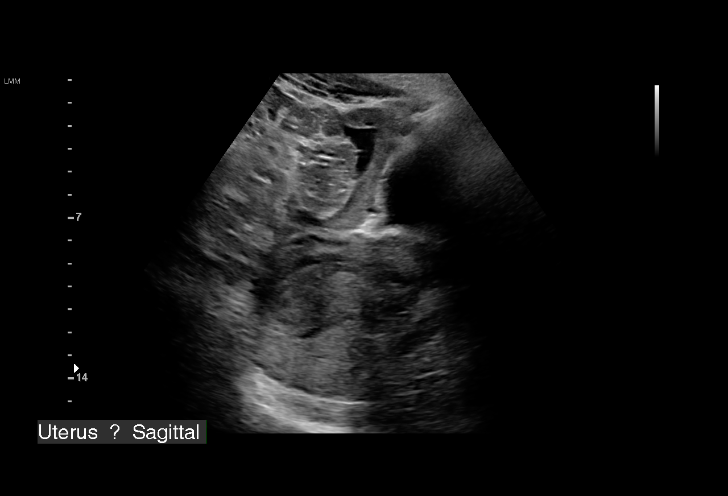
[im 3/21]
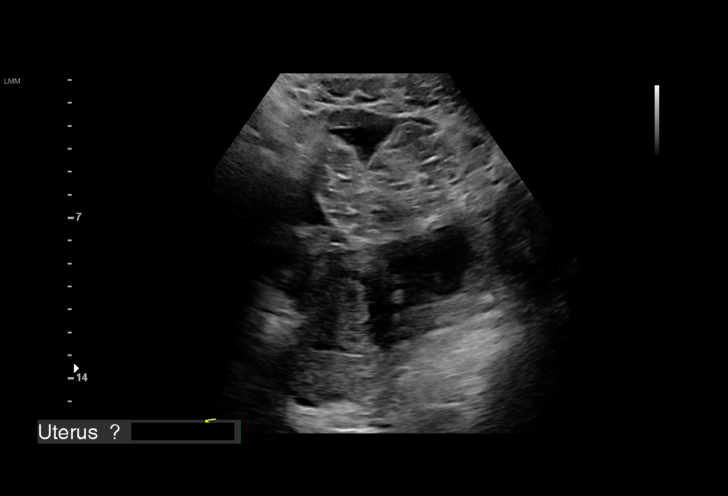
[im 4/21]
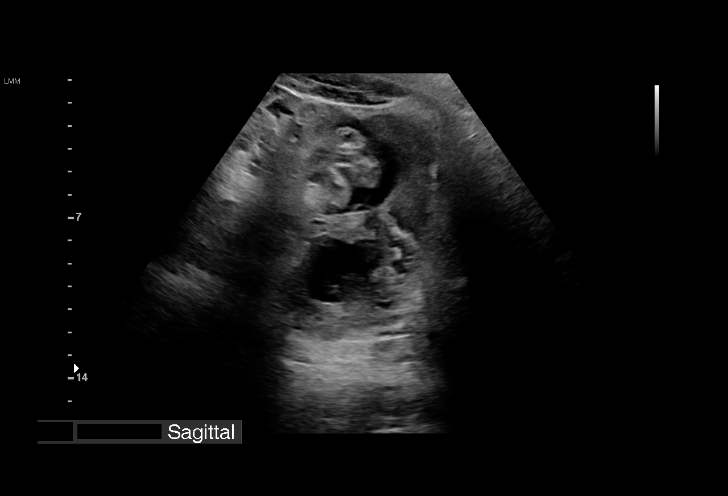
[im 5/21]
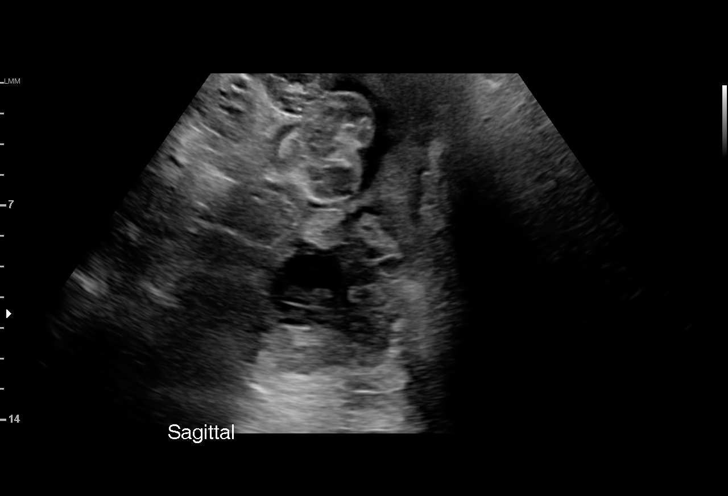
[im 7/21]
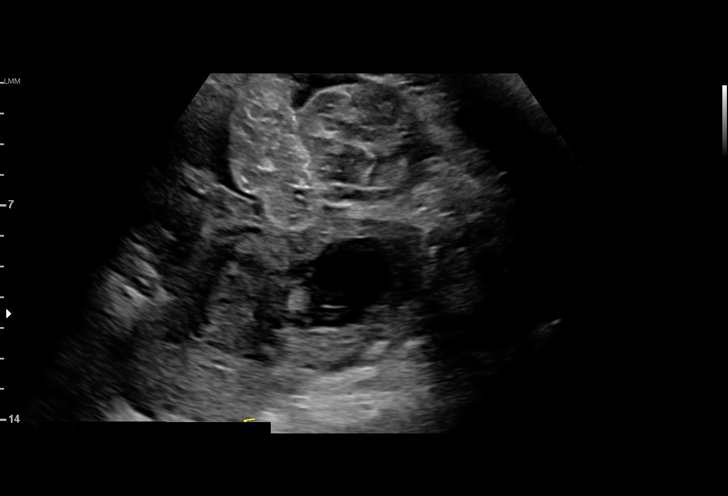
[im 8/21]
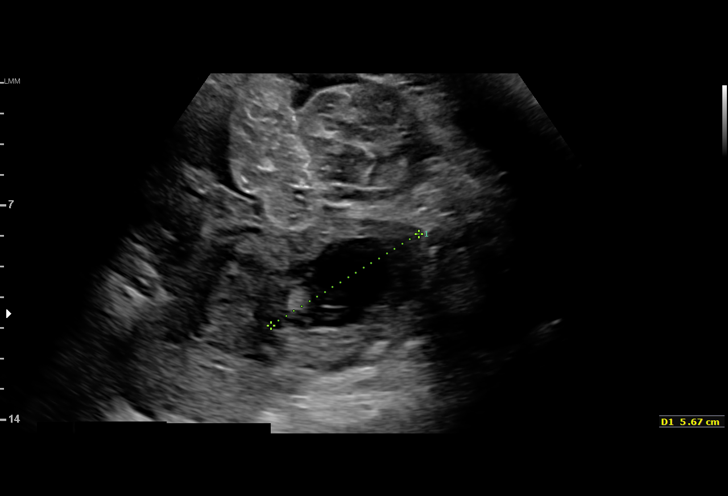
[im 10/21]
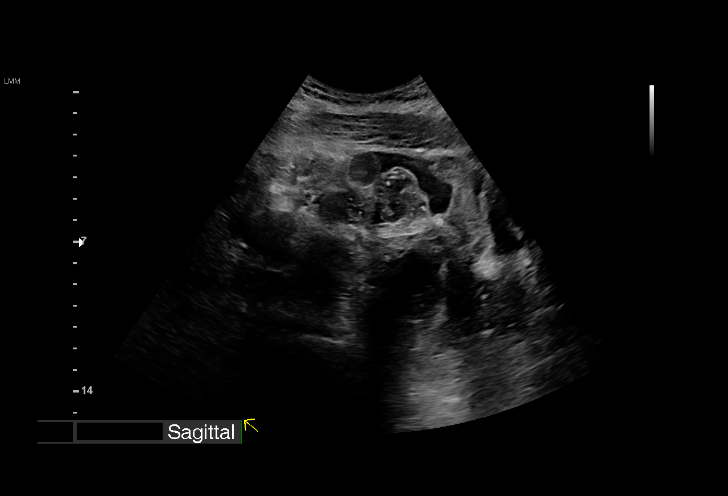
[im 11/21]
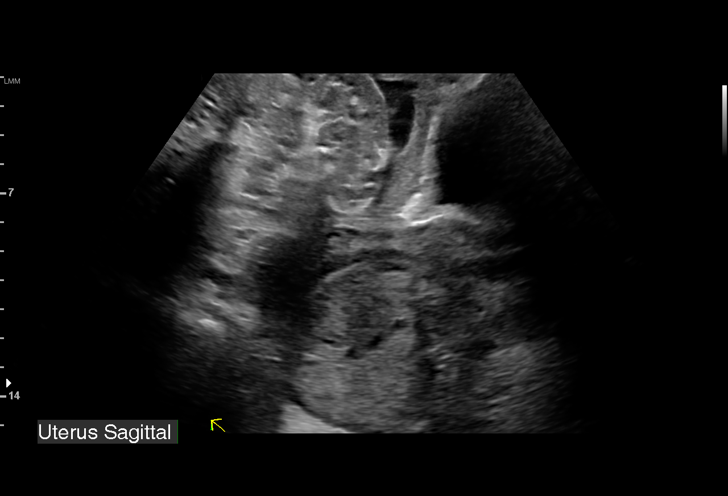
[im 12/21]
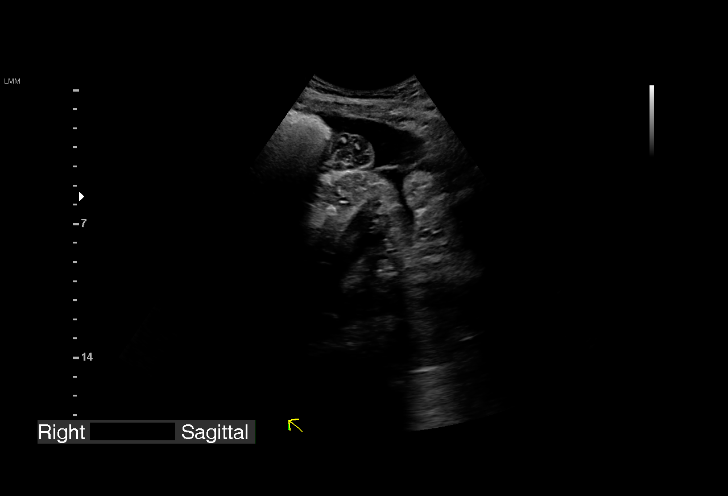
[im 14/21]
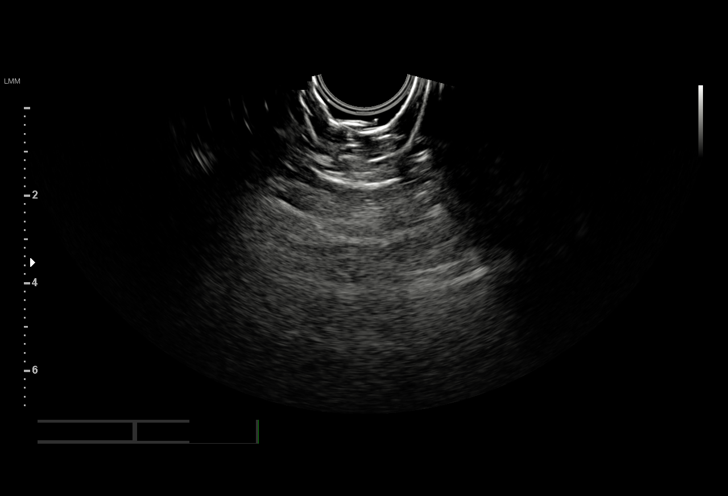
[im 15/21]
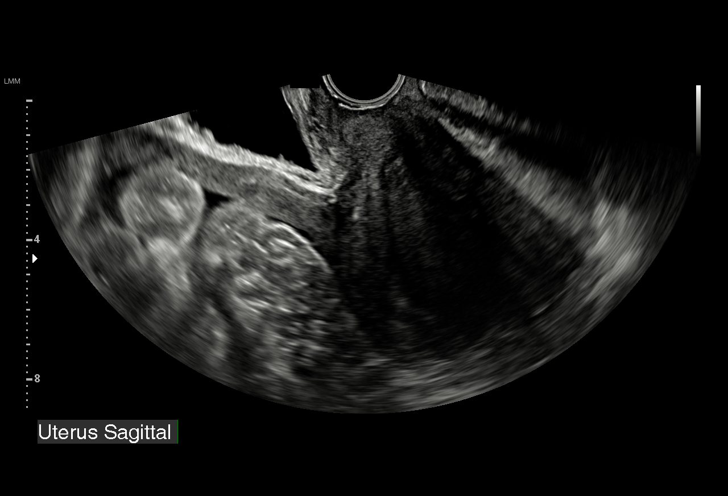
[im 17/21]
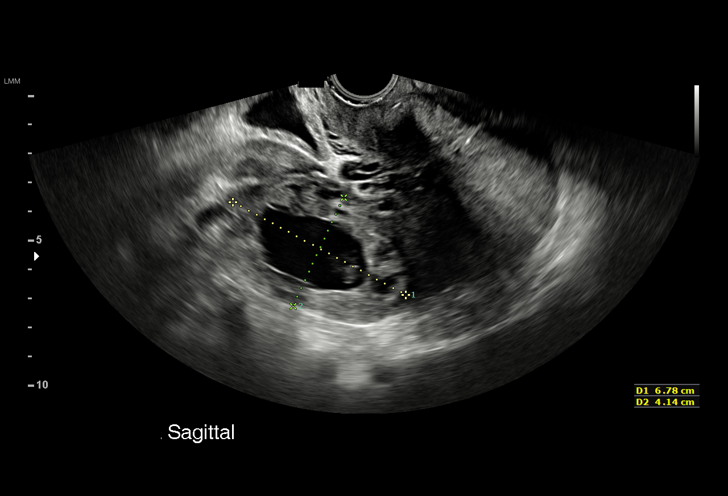
[im 18/21]
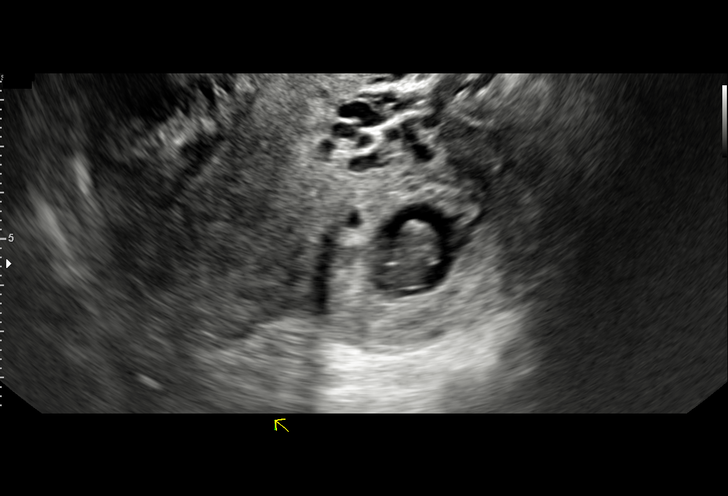
[im 19/21]
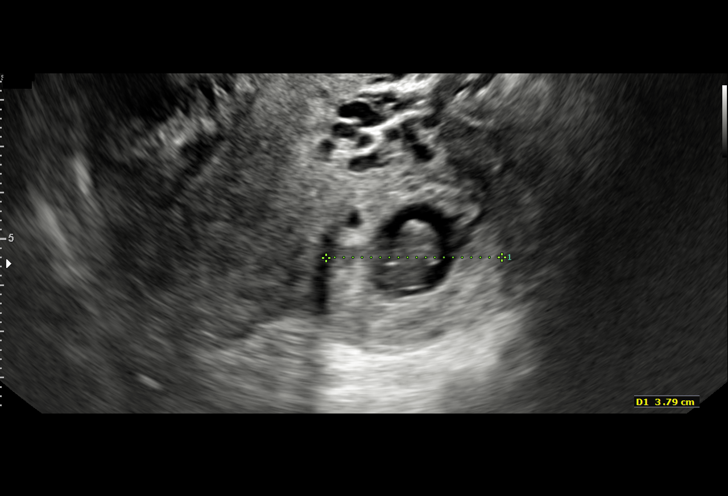
[im 21/21]
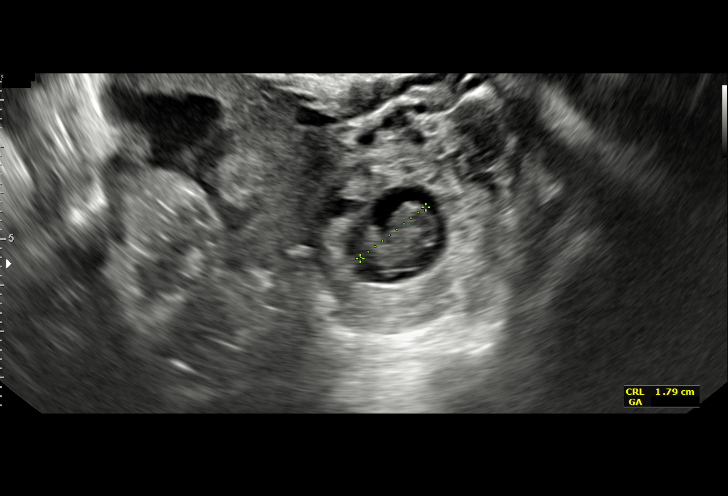

[15 of 21 positions shown; findings below may reference images not displayed]

FINDINGS: Intrauterine gestational sac: Not visualized

Maternal uterus/adnexae:

A 6.8 x 4.1 x 3.8 cm LEFT adnexal mass with probable gestational sac
and fetal pole noted.

The RIGHT ovary is not well visualized.

A small to moderate amount of free fluid within the pelvis noted.
IMPRESSION: 6.8 cm LEFT adnexal mass containing probable gestational/fetal pole
highly suspicious for LEFT ectopic pregnancy. Small to moderate free
fluid.

Unremarkable uterus.

RIGHT ovary not visualized.

Critical Value/emergent results were called by telephone at the time
of interpretation on 05/16/2017 at [DATE] to BERND HOULIHAN , who
verbally acknowledged these results.

## 2019-03-08 ENCOUNTER — Ambulatory Visit (INDEPENDENT_AMBULATORY_CARE_PROVIDER_SITE_OTHER): Payer: No Typology Code available for payment source | Admitting: Licensed Clinical Social Worker

## 2019-03-08 ENCOUNTER — Other Ambulatory Visit: Payer: Self-pay

## 2019-03-08 ENCOUNTER — Ambulatory Visit (INDEPENDENT_AMBULATORY_CARE_PROVIDER_SITE_OTHER): Payer: No Typology Code available for payment source | Admitting: Internal Medicine

## 2019-03-08 DIAGNOSIS — F32A Depression, unspecified: Secondary | ICD-10-CM | POA: Insufficient documentation

## 2019-03-08 DIAGNOSIS — R358 Other polyuria: Secondary | ICD-10-CM

## 2019-03-08 DIAGNOSIS — F411 Generalized anxiety disorder: Secondary | ICD-10-CM | POA: Diagnosis not present

## 2019-03-08 DIAGNOSIS — R3589 Other polyuria: Secondary | ICD-10-CM

## 2019-03-08 DIAGNOSIS — R631 Polydipsia: Secondary | ICD-10-CM

## 2019-03-08 DIAGNOSIS — F329 Major depressive disorder, single episode, unspecified: Secondary | ICD-10-CM

## 2019-03-08 HISTORY — DX: Depression, unspecified: F32.A

## 2019-03-08 HISTORY — DX: Generalized anxiety disorder: F41.1

## 2019-03-08 MED ORDER — BUSPIRONE HCL 7.5 MG PO TABS: 7.5000 mg | ORAL_TABLET | Freq: Two times a day (BID) | ORAL | 2 refills | Status: DC

## 2019-03-08 MED ORDER — ESCITALOPRAM OXALATE 10 MG PO TABS: 10.0000 mg | ORAL_TABLET | Freq: Every day | ORAL | 3 refills | Status: DC

## 2019-03-08 NOTE — Progress Notes (Signed)
Virtual Visit via Telephone Note Due to current restrictions/limitations of in-office visits due to the COVID-19 pandemic, this scheduled clinical appointment was converted to a telehealth visit  I connected with [place] on 03/08/19 at 9:30 a.m by telephone and verified that I am speaking with the correct person using two identifiers. I am in my office.  The patient is at home.  Only the patient and myself participated in this encounter.  I discussed the limitations, risks, security and privacy concerns of performing an evaluation and management service by telephone and the availability of in person appointments. I also discussed with the patient that there may be a patient responsible charge related to this service. The patient expressed understanding and agreed to proceed.   History of Present Illness: Patient with history of anxiety disorder.  This is a new patient evaluation and follow-up from the urgent care visit.  Previous primary was at [facility]. Last seen sometime last yr.  They did not take her insurance.    Main concern is anxiety.  This was going on from last yr.  "I have terrible anxiety.  I over think things, I have bad thoughts.  I can't even clean up my house sometimes.  I'm depressed."  She had two ectopic pregnancies this yr.  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  She does not like being alone so she sometimes keeps her 76-year-old daughter out of daycare so as not to be alone.  Denies SI.   -currently on Lexapro and xanax from UC visit 02/01/2019.  The Lexapro was written as 10 mg to take for 1 week then increase to 20 mg.  However she has remained on the 10 mg because she was afraid of increasing the dose.  Felt the Xanax helped a lot in calming her down.  She feels she would benefit from talking with somebody but she does not want to be thought of as being "crazy.".  Would like to be screened for DM.  Fhx of DM in father. Gets dizzy if she does not eat.   Sometimes she has blurred vision.  Endorses polyuria and polydipsia.  She has gained wgh.   Reports eating habits "are terrible."  Does not eat until late afternoon.  Anxiety gets in the way.  Snacking more while she works from home.  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 Not very active.  She feels working from home makes her anxiety worse.  Observations/Objective: Patient tearful at times during the conversation.  Assessment and Plan: 1. Generalized anxiety disorder Advised patient that anxiety and depression are medical conditions that can be treated.  Because the anxiety is interfering with her normal functioning, I suggest that it be aggressively treated.  I have reassured her that she is not crazy for wanting to feel better. -I recommend continuing Lexapro 10 mg daily.  Instead of continuing Xanax which can be habit-forming, I recommend adding BuSpar. -Encourage her to get established with a mental care provider and she is agreeable to that.  Referral submitted.  I will also have our LCSW follow-up with her. -Stressed the importance of healthy eating habits and trying to be more active which will also help improve her overall sense of wellbeing.  Encouraged her to try getting some form of moderate intensity exercise several days a week for 30 minutes. - TSH; Future - busPIRone (BUSPAR) 7.5 MG tablet; Take 1 tablet (7.5 mg total) by mouth 2 (two) times  daily.  Dispense: 60 tablet; Refill: 2 - escitalopram (LEXAPRO) 10 MG tablet; Take 1 tablet (10 mg total) by mouth daily.  Dispense: 30 tablet; Refill: 3 - Ambulatory referral to Psychiatry  2. Depression, unspecified depression type See #1 above - Ambulatory referral to Psychiatry  3. Polyuria - Hemoglobin A1c; Future - Basic metabolic panel; Future  4. Polydipsia - Hemoglobin A1c; Future - Basic metabolic panel; Future   Follow Up Instructions: 6 wks   I discussed the assessment and treatment plan with the patient.  The patient was provided an opportunity to ask questions and all were answered. The patient agreed with the plan and demonstrated an understanding of the instructions.   The patient was advised to call back or seek an in-person evaluation if the symptoms worsen or if the condition fails to improve as anticipated.  I provided 23 minutes of non-face-to-face time during this encounter.   Jonah Blue, MD

## 2019-03-08 NOTE — BH Specialist Note (Signed)
Integrated Behavioral Health Visit via Telemedicine (Telephone)  03/08/2019 Sara Rubio [id number]   Session Start time: 3:00 PM  Session End time: 3:20 AM Total time: 20  Referring Provider: Dr. Wynetta Emery Type of Visit: Telephonic Patient location: Home [facility] Provider location: Office All persons participating in visit: LCSW and Patient  Confirmed patient's address: Yes  Confirmed patient's phone number: Yes  Any changes to demographics: No   Confirmed patient's insurance: Yes  Any changes to patient's insurance: No   Discussed confidentiality: Yes    The following statements were read to the patient and/or legal guardian that are established with the [facility] Provider.  "The purpose of this phone visit is to provide behavioral health care while limiting exposure to the coronavirus (COVID19).  There is a possibility of technology failure and discussed alternative modes of communication if that failure occurs."  "By engaging in this telephone visit, you consent to the provision of healthcare.  Additionally, you authorize for your insurance to be billed for the services provided during this telephone visit."   Patient and/or legal guardian consented to telephone visit: Yes   PRESENTING CONCERNS: Patient and/or family reports the following symptoms/concerns: Pt reports difficulty managing mental health conditions triggered by grief. Symptoms include racing thoughts, fear of being alone, nightmares, irritability, sadness with crying episodes, decreased appetite, and panic attacks (1-2 weekly) Duration of problem: Ongoing; Severity of problem: severe  STRENGTHS (Protective Factors/Coping Skills): Pt has desire for change Pt will implement coping skills to motivate change  GOALS ADDRESSED: Patient will: 1.  Reduce symptoms of: anxiety  2.  Increase knowledge and/or ability of: coping skills and healthy habits  3.  Demonstrate ability to: Increase healthy adjustment to  current life circumstances, Increase adequate support systems for patient/family and Increase motivation to adhere to plan of care  INTERVENTIONS: Interventions utilized:  Solution-Focused Strategies, Supportive Counseling and Psychoeducation and/or Health Education Standardized Assessments completed: Not Needed  ASSESSMENT: Patient currently experiencing difficulty managing mental health conditions triggered by grief. Symptoms include racing thoughts, fear of being alone, nightmares, irritability, sadness with crying episodes, decreased appetite, and panic attacks (1-2 weekly). Denies SI/HI  Patient may benefit from psychotherapy and medication management. PCP has prescribed medication to assist with anxiety and depression symptoms. Pt is willing to consider medication management after she conducts research on the specific meds prescribed.   LCSW discussed coping skills that could assist in the management and/or decrease of reported symptoms. Pt has an upcoming appt with PCP on 04/19/2019  PLAN: 1. Follow up with behavioral health clinician on : Contact LCSW with any additional behavioral health or resource needs 2. Behavioral recommendations: Utilize healthy coping skills discussed, including medication management should patient agree to participate 3. Referral(s): [place] (In Clinic)  Rebekah [place], [place] 03/17/2019 8:15 AM

## 2019-03-09 IMAGING — CT CT ABD-PELV W/ CM
1 of 3 series · 13 of 32 positions shown, 18 images · IV contrast (OMNIPAQUE)
Comparison: None.

CLINICAL DATA: Low pelvic pain after left salpingectomy on
05/16/2017.

EXAM:
CT ABDOMEN AND PELVIS WITH CONTRAST
TECHNIQUE: Multidetector CT imaging of the abdomen and pelvis was performed
using the standard protocol following bolus administration of
intravenous contrast.
CONTRAST:  100 cc Csovue-RFF

[Series 2: routine abdomen/pelvis with · axial · 0.74mm/px · z∈[+642,+1002]mm · 13 of 84 slices shown, 18 images]
[im 6/84  soft-tissue]
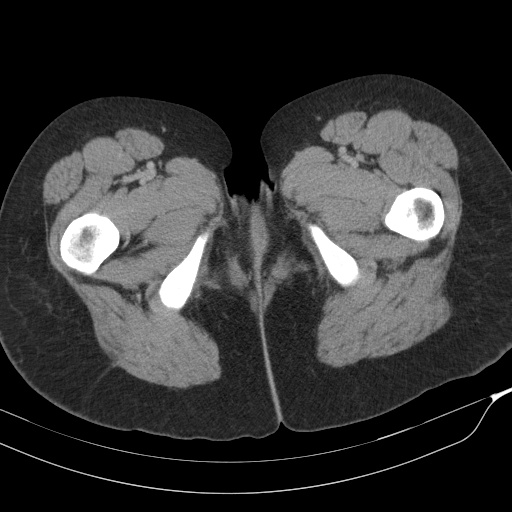
[im 6/84  bone]
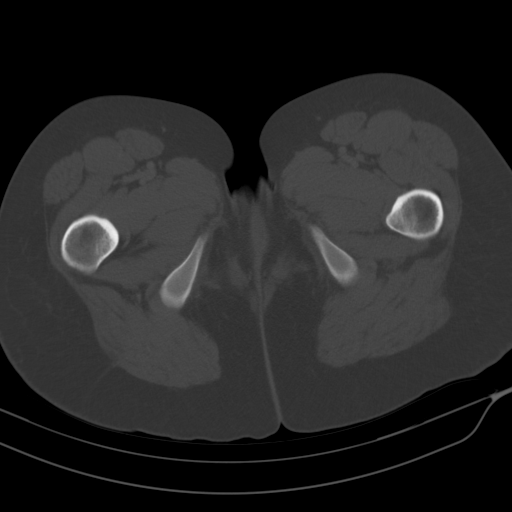
[im 11/84  soft-tissue]
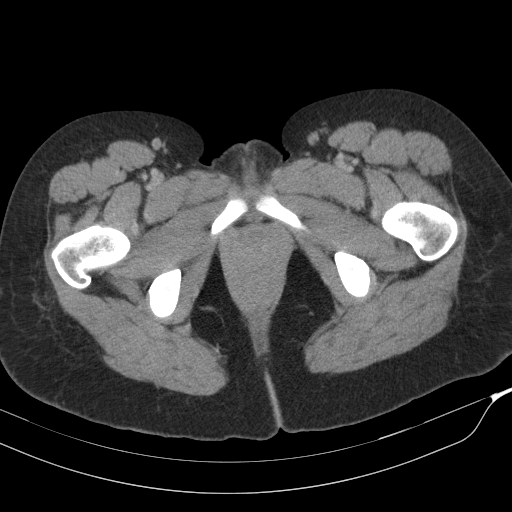
[im 21/84  soft-tissue]
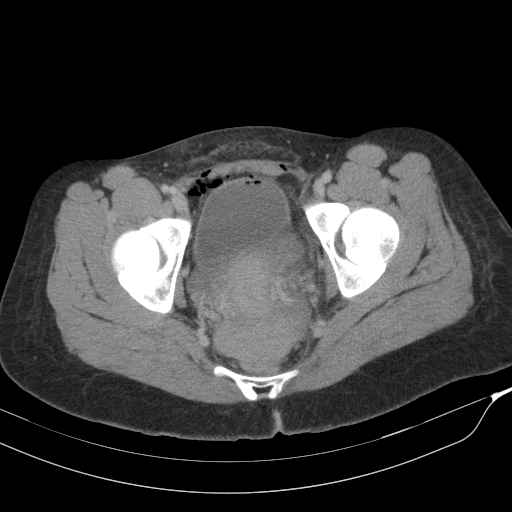
[im 26/84  soft-tissue]
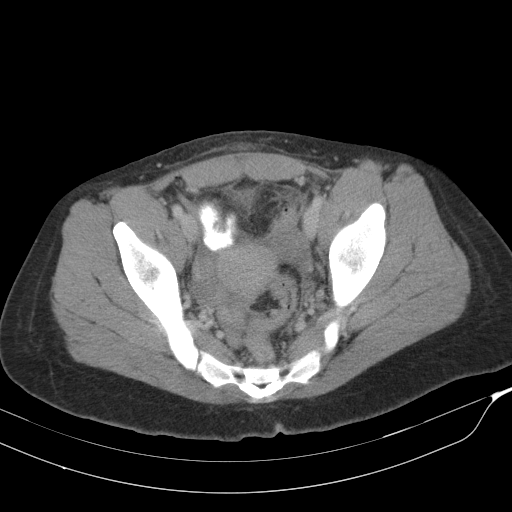
[im 32/84  soft-tissue]
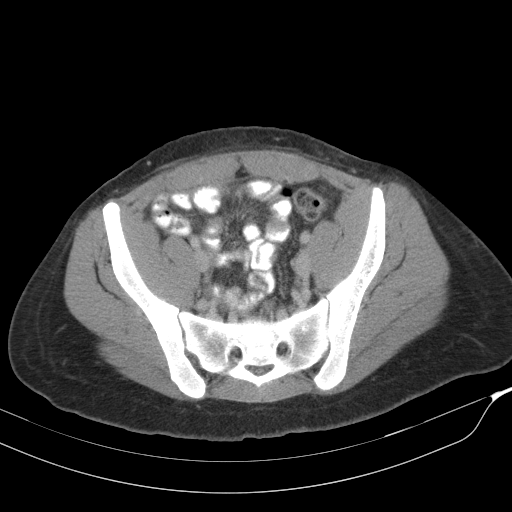
[im 37/84  soft-tissue]
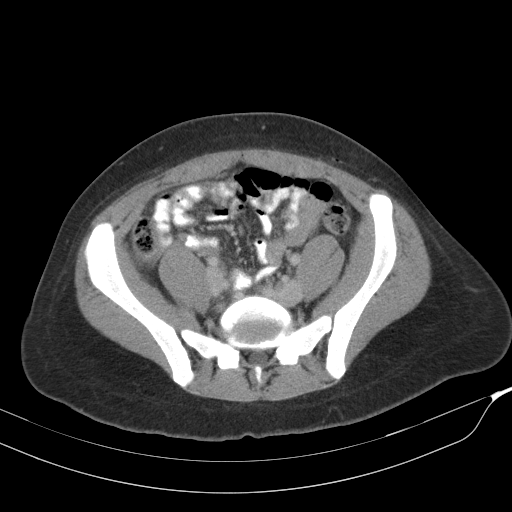
[im 47/84  soft-tissue]
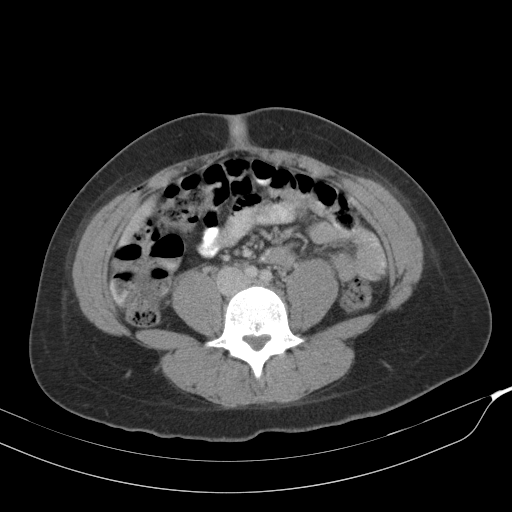
[im 52/84  soft-tissue]
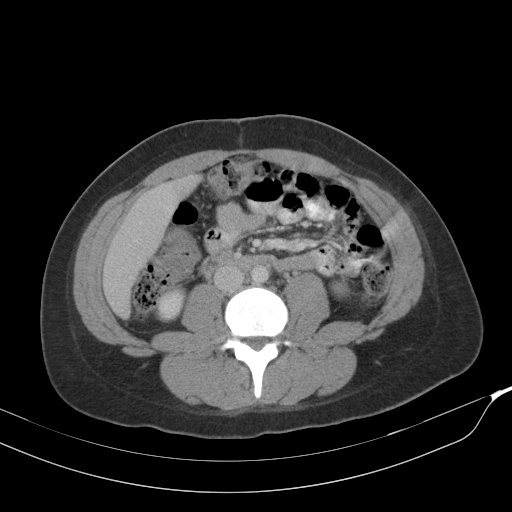
[im 58/84  soft-tissue]
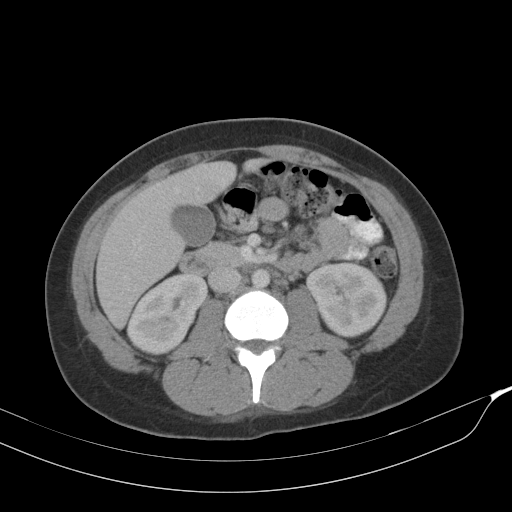
[im 58/84  bone]
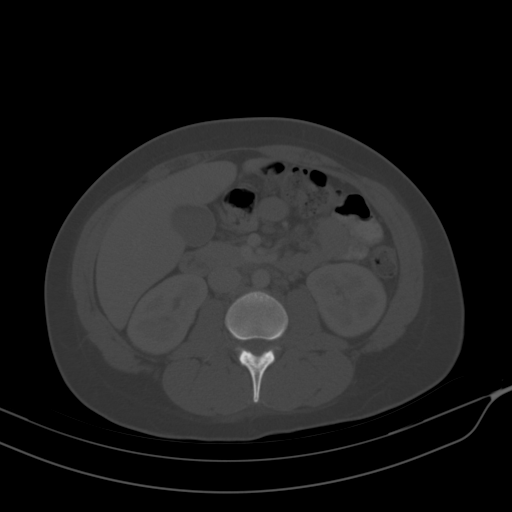
[im 63/84  soft-tissue]
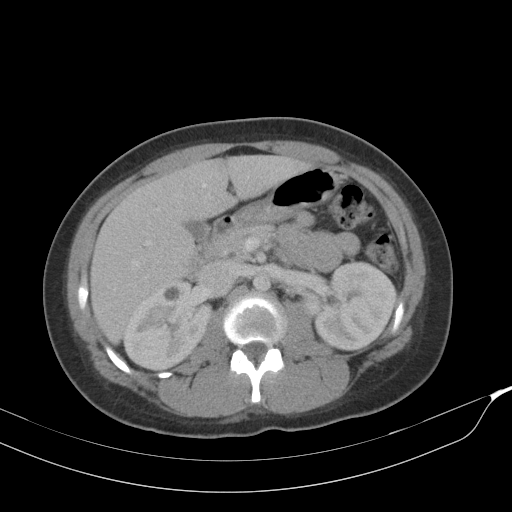
[im 63/84  lung]
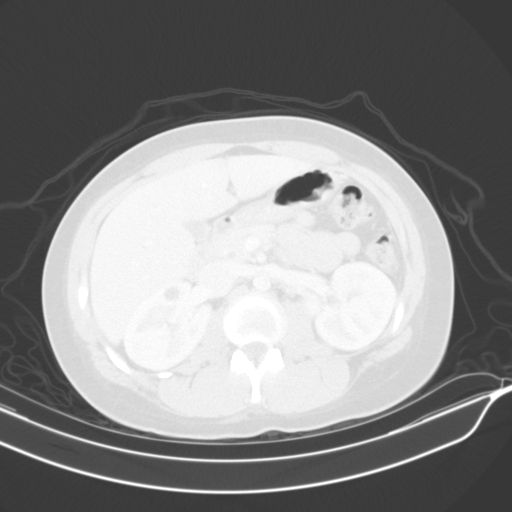
[im 68/84  lung]
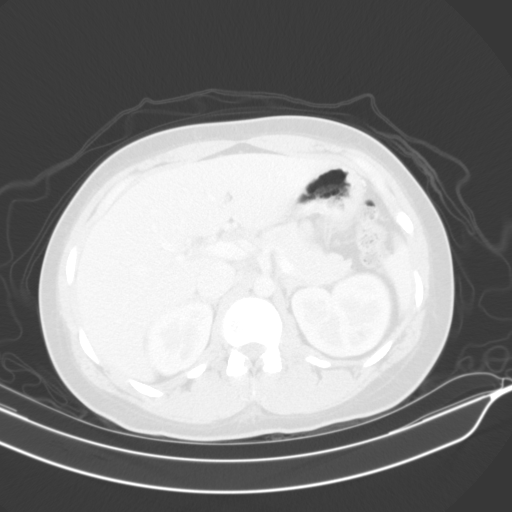
[im 73/84  soft-tissue]
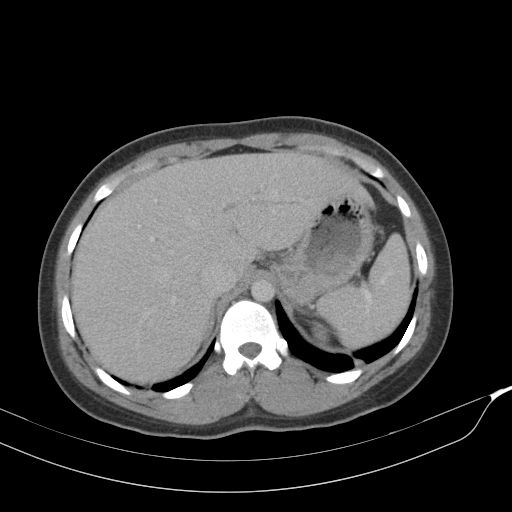
[im 73/84  lung]
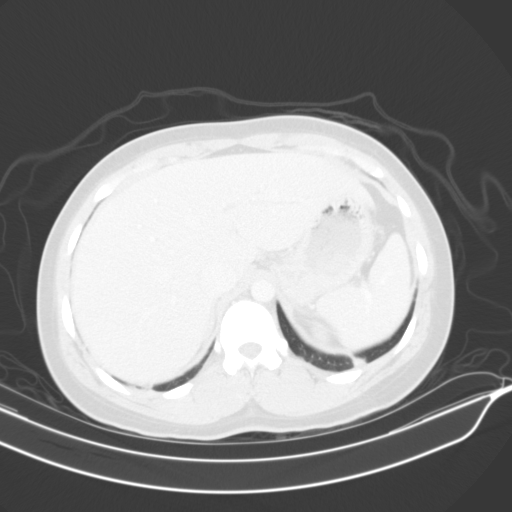
[im 78/84  soft-tissue]
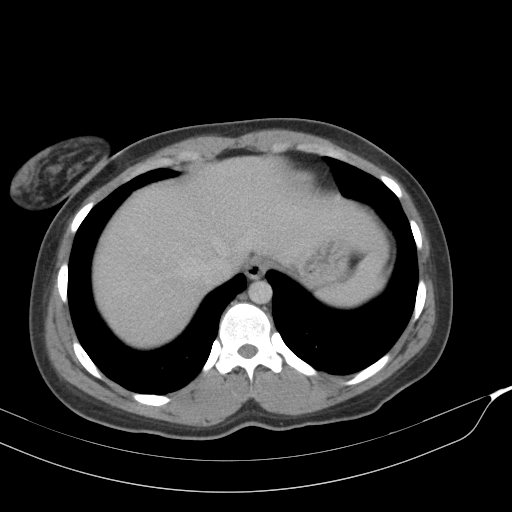
[im 78/84  lung]
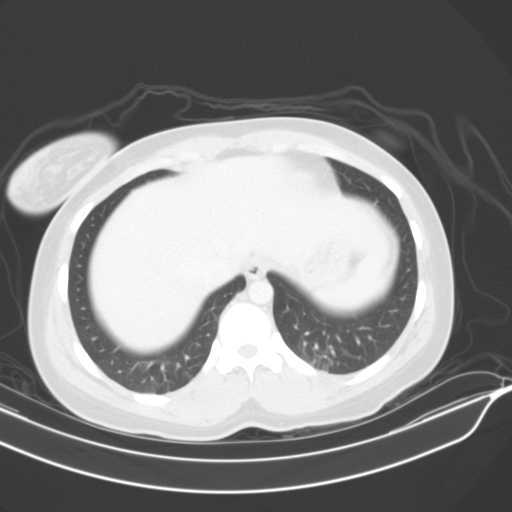

[13 of 32 positions shown; findings below may reference images not displayed]

FINDINGS: Lower chest: Subsegmental atelectasis noted posterior lung bases.

Hepatobiliary: No focal abnormality within the liver parenchyma.
Layering sludge noted in the gallbladder. No intrahepatic or
extrahepatic biliary dilation.

Pancreas: No focal mass lesion. No dilatation of the main duct. No
intraparenchymal cyst. No peripancreatic edema.

Spleen: No splenomegaly. No focal mass lesion.

Adrenals/Urinary Tract: No adrenal nodule or mass. 14 mm
well-defined low-density lesion interpolar right kidney is
compatible with a cyst. Left kidney unremarkable. No evidence for
hydroureter. Trace amount a gas in the urinary bladder presumably
related to recent instrumentation.

Stomach/Bowel: Stomach is nondistended. No gastric wall thickening.
No evidence of outlet obstruction. Duodenum is normally positioned
as is the ligament of Treitz. No small bowel wall thickening. No
small bowel dilatation. The terminal ileum is normal. The appendix
is normal. No gross colonic mass. No colonic wall thickening. No
substantial diverticular change.

Vascular/Lymphatic: No abdominal aortic aneurysm. No abdominal
aortic atherosclerotic calcification.. Portal vein and superior
mesenteric vein are patent. There is no gastrohepatic or
hepatoduodenal ligament lymphadenopathy. No intraperitoneal or
retroperitoneal lymphadenopathy.. No pelvic sidewall
lymphadenopathy. 8 mm short axis right common iliac lymph node is
upper normal.

Reproductive: Uterus unremarkable. No overt mass lesion evident in
either adnexal region.

Other: Small volume of high attenuation fluid in the cul-de-sac is
compatible with hemoperitoneum. This collection measures 3 x 6.3 x
3.4 cm. There is some minimal fluid in the adnexal regions
bilaterally. No fluid in the para colic gutters. No fluid around the
liver or spleen. There is a tiny amount of gas in the prevesical
space and in the soft tissues adjacent to the uterus, not unexpected
postoperative day 5.

Musculoskeletal: Bone windows reveal no worrisome lytic or sclerotic
osseous lesions.
IMPRESSION: 1. Small collection of hemoperitoneum in the cul-de-sac without
overt fluid in either adnexal region. No fluid identified in the
para colic gutters or in the abdomen. No evidence for active
contrast extravasation to suggest ongoing bleeding.
2. Tiny amount a gas in the prevesical space and central pelvis, not
unexpected on postoperative day 5.
3. Tiny gas bubble in the bladder likely related to recent
instrumentation.

I discussed these findings by telephone with Andry Fletes at [id number]
hours on 05/21/2017.

## 2019-03-17 ENCOUNTER — Other Ambulatory Visit: Payer: Self-pay

## 2019-03-17 ENCOUNTER — Ambulatory Visit (HOSPITAL_COMMUNITY): Payer: No Typology Code available for payment source | Admitting: Licensed Clinical Social Worker

## 2019-03-17 ENCOUNTER — Telehealth (HOSPITAL_COMMUNITY): Payer: Self-pay | Admitting: Licensed Clinical Social Worker

## 2019-03-17 NOTE — Telephone Encounter (Signed)
Sara Rubio previously scheduled a virtual assessment today for 8am.  Clinician previously sent Webex invite to her e-mail, but did she not present for this virtual session today.  Clinician attempted to contact her by phone at 8:01am in case she was having connectivity issues, but she did not answer, so a voicemail was left reminding her of this appointment.  Clinician included numbers to personal office and front desk in case she wishes to reschedule future appointment.  Clinician asked front desk staff to mark this as a no show event when she did not return call or present for virtual meeting after 15-20 minutes had passed.    [address] [place], [place] 03/17/19

## 2019-04-01 ENCOUNTER — Other Ambulatory Visit: Payer: Self-pay | Admitting: Internal Medicine

## 2019-04-01 DIAGNOSIS — F411 Generalized anxiety disorder: Secondary | ICD-10-CM

## 2019-04-01 NOTE — Telephone Encounter (Signed)
Please refill if appropriate to continue, Patient has no assigned PCP

## 2019-04-18 ENCOUNTER — Telehealth: Payer: Self-pay

## 2019-04-18 NOTE — Telephone Encounter (Signed)

## 2019-04-19 ENCOUNTER — Other Ambulatory Visit: Payer: Self-pay

## 2019-04-19 ENCOUNTER — Ambulatory Visit (INDEPENDENT_AMBULATORY_CARE_PROVIDER_SITE_OTHER): Payer: No Typology Code available for payment source | Admitting: Family

## 2019-04-19 VITALS — Resp 18 | Ht 68.0 in | Wt 188.0 lb

## 2019-04-19 DIAGNOSIS — F411 Generalized anxiety disorder: Secondary | ICD-10-CM

## 2019-04-19 DIAGNOSIS — R350 Frequency of micturition: Secondary | ICD-10-CM | POA: Diagnosis not present

## 2019-04-19 DIAGNOSIS — Z131 Encounter for screening for diabetes mellitus: Secondary | ICD-10-CM

## 2019-04-19 DIAGNOSIS — F339 Major depressive disorder, recurrent, unspecified: Secondary | ICD-10-CM

## 2019-04-19 DIAGNOSIS — Z1329 Encounter for screening for other suspected endocrine disorder: Secondary | ICD-10-CM

## 2019-04-19 NOTE — Patient Instructions (Signed)
Return in 3 months for follow-up of anxiety or sooner if needed.  Generalized Anxiety Disorder, Adult Generalized anxiety disorder (GAD) is a mental health disorder. People with this condition constantly worry about everyday events. Unlike normal anxiety, worry related to GAD is not triggered by a specific event. These worries also do not fade or get better with time. GAD interferes with life functions, including relationships, work, and school. GAD can vary from mild to severe. People with severe GAD can have intense waves of anxiety with physical symptoms (panic attacks). What are the causes? The exact cause of GAD is not known. What increases the risk? This condition is more likely to develop in:  Women.  People who have a family history of anxiety disorders.  People who are very shy.  People who experience very stressful life events, such as the death of a loved one.  People who have a very stressful family environment. What are the signs or symptoms? People with GAD often worry excessively about many things in their lives, such as their health and family. They may also be overly concerned about:  Doing well at work.  Being on time.  Natural disasters.  Friendships. Physical symptoms of GAD include:  Fatigue.  Muscle tension or having muscle twitches.  Trembling or feeling shaky.  Being easily startled.  Feeling like your heart is pounding or racing.  Feeling out of breath or like you cannot take a deep breath.  Having trouble falling asleep or staying asleep.  Sweating.  Nausea, diarrhea, or irritable bowel syndrome (IBS).  Headaches.  Trouble concentrating or remembering facts.  Restlessness.  Irritability. How is this diagnosed? Your health care provider can diagnose GAD based on your symptoms and medical history. You will also have a physical exam. The health care provider will ask specific questions about your symptoms, including how severe they are,  when they started, and if they come and go. Your health care provider may ask you about your use of alcohol or drugs, including prescription medicines. Your health care provider may refer you to a mental health specialist for further evaluation. Your health care provider will do a thorough examination and may perform additional tests to rule out other possible causes of your symptoms. To be diagnosed with GAD, a person must have anxiety that:  Is out of his or her control.  Affects several different aspects of his or her life, such as work and relationships.  Causes distress that makes him or her unable to take part in normal activities.  Includes at least three physical symptoms of GAD, such as restlessness, fatigue, trouble concentrating, irritability, muscle tension, or sleep problems. Before your health care provider can confirm a diagnosis of GAD, these symptoms must be present more days than they are not, and they must last for six months or longer. How is this treated? The following therapies are usually used to treat GAD:  Medicine. Antidepressant medicine is usually prescribed for long-term daily control. Antianxiety medicines may be added in severe cases, especially when panic attacks occur.  Talk therapy (psychotherapy). Certain types of talk therapy can be helpful in treating GAD by providing support, education, and guidance. Options include: ? Cognitive behavioral therapy (CBT). People learn coping skills and techniques to ease their anxiety. They learn to identify unrealistic or negative thoughts and behaviors and to replace them with positive ones. ? Acceptance and commitment therapy (ACT). This treatment teaches people how to be mindful as a way to cope with unwanted thoughts  and feelings. ? Biofeedback. This process trains you to manage your body's response (physiological response) through breathing techniques and relaxation methods. You will work with a therapist while machines  are used to monitor your physical symptoms.  Stress management techniques. These include yoga, meditation, and exercise. A mental health specialist can help determine which treatment is best for you. Some people see improvement with one type of therapy. However, other people require a combination of therapies. Follow these instructions at home:  Take over-the-counter and prescription medicines only as told by your health care provider.  Try to maintain a normal routine.  Try to anticipate stressful situations and allow extra time to manage them.  Practice any stress management or self-calming techniques as taught by your health care provider.  Do not punish yourself for setbacks or for not making progress.  Try to recognize your accomplishments, even if they are small.  Keep all follow-up visits as told by your health care provider. This is important. Contact a health care provider if:  Your symptoms do not get better.  Your symptoms get worse.  You have signs of depression, such as: ? A persistently sad, cranky, or irritable mood. ? Loss of enjoyment in activities that used to bring you joy. ? Change in weight or eating. ? Changes in sleeping habits. ? Avoiding friends or family members. ? Loss of energy for normal tasks. ? Feelings of guilt or worthlessness. Get help right away if:  You have serious thoughts about hurting yourself or others. If you ever feel like you may hurt yourself or others, or have thoughts about taking your own life, get help right away. You can go to your nearest emergency department or call:  Your local emergency services (911 in the [place]  A suicide crisis helpline, such as the National Suicide Prevention Lifeline at (517) 149-3123. This is open 24 hours a day. Summary  Generalized anxiety disorder (GAD) is a mental health disorder that involves worry that is not triggered by a specific event.  People with GAD often worry excessively about  many things in their lives, such as their health and family.  GAD may cause physical symptoms such as restlessness, trouble concentrating, sleep problems, frequent sweating, nausea, diarrhea, headaches, and trembling or muscle twitching.  A mental health specialist can help determine which treatment is best for you. Some people see improvement with one type of therapy. However, other people require a combination of therapies. This information is not intended to replace advice given to you by your health care provider. Make sure you discuss any questions you have with your health care provider. Document Revised: 02/20/2017 Document Reviewed: 01/29/2016 Elsevier Patient Education  2020 [employer].

## 2019-04-19 NOTE — Progress Notes (Signed)
Patient ID: Sara Rubio, female    DOB: [birth year]  MRN: [id number]  CC: Anxiety  Subjective: Sara Rubio is a 24 y.o. female who presents for concerns related to anxiety.  1. FOLLOW-UP VISIT FOR ANXIETY:  Last seen by Dr. Laural Benes on 03/08/2019 and diagnosed with generalized anxiety disorder. Was prescribed Escitalopram and Buspirone for management of generalized anxiety disorder and referred to psychiatry. Patient also spoke with licensed clinical social worker related to anxiety on 03/08/2019.   Patient presents today with concerns of anxiety disorder and panic attacks.  She has the following symptoms: chest pain, difficulty concentrating, dizziness, feelings of losing control, insomnia, irritable, palpitations, racing thoughts, shortness of breath, sweating. Onset of symptoms was approximately several years ago, stable since that time. She denies current suicidal and homicidal ideation. Family history significant for no psychiatric illness.Possible organic causes contributing are: none. Risk factors: previous episode of depression Previous treatment includes BuSpar, Lexapro and Xanax and none.  She complains of the following side effects from the treatment: headache and insomnia. She denies current suicidal and homicidal plan or intent.    Admits that she took Escitalopram for only 2 weeks. Reports that the medication made her feel jittery, too focused, and reports that her pupils became huge. The side effects of Escitalopram scared her so she quit taking the medication. States that the medication bottle reads that she was supposed to take 1 pill daily for 2 weeks and then increase to 2 pills daily. States that she never increased the medication to 2 pills per day because of side effects. Reports that she uses [employer] to look up medication side effects which frightened her even more. Reports that she did feel a little better when taking Escitalopram. However, she is unsure if she really felt  better taking Escitalopram or was it the thought that she was taking medication that was supposed to help her feel better is what actually made her feel better. Only took Buspar once so unsure if she had side effects.   Reports that overall health causes anxiety and feelings of what if something goes wrong in her daily personal life and 31 year-old daughter. States that her job causes anxiety because she has to work from 10:00 am until 9:00 pm Monday through Friday. Reports that she is actively trying to find another job. Reports chest pain, shortness of breath, palpitations, headaches, and insomnia when anxiety becomes severe. Had a previous referral with a psychiatrist. States that she never went to the appointment because she would be considered crazy if she goes.    GAD 7 : Generalized Anxiety Score 04/19/2019 03/08/2019  Nervous, Anxious, on Edge 3 3  Control/stop worrying 3 3  Worry too much - different things 3 3  Trouble relaxing 3 3  Restless 3 3  Easily annoyed or irritable 3 3  Afraid - awful might happen 3 3  Total GAD 7 Score 21 21    2. DEPRESSION:  Depression: Patient complains of depression. She complains of depressed mood, difficulty concentrating and hopelessness. Onset was approximately several years ago, unchanged since that time.  She denies current suicidal and homicidal plan or intent. Family history significant for no psychiatric illness. Possible organic causes contributing are: none. Risk factors: previous episode of depression Previous treatment includes BuSpar, Lexapro and Xanax . She complains of the following side effects from the treatment: headache and insomnia.  PHQ9 SCORE ONLY 04/19/2019 03/08/2019  Score 9 0     3. FREQUENT URINATION  AND THIRST:  Admits being thirty especially at night. Admits consuming sugary foods and drinks. Admits eating salty foods, fried foods, and fast food. Has family history of diabetes.  Results for orders placed or performed  during the hospital encounter of 11/30/18  Cervicovaginal ancillary only  Result Value Ref Range   Bacterial vaginitis Negative for Bacterial Vaginitis Microorganisms    Candida vaginitis **POSITIVE for Candida species** (A)    Trichomonas Negative    Chlamydia Negative    Neisseria Gonorrhea Negative       Patient Active Problem List   Diagnosis Date Noted  . Generalized anxiety disorder 03/08/2019  . Depression 03/08/2019  . Acute pain of right knee 01/06/2019  . Anxiety 05/17/2017  . s/p ELAP, left cornual wedge resection & salpingectomy for ruptured cornual ectopic pregnancy on 05/16/17 05/16/2017  . Genital herpes      Current Outpatient Medications on File Prior to Visit  Medication Sig Dispense Refill  . ALPRAZolam (XANAX) 0.5 MG tablet Take 1 tablet (0.5 mg total) by mouth 2 (two) times daily as needed for anxiety. (Patient not taking: Reported on 03/08/2019) 20 tablet 0  . busPIRone (BUSPAR) 7.5 MG tablet TAKE 1 TABLET (7.5 MG TOTAL) BY MOUTH 2 (TWO) TIMES DAILY. 180 tablet 1  . escitalopram (LEXAPRO) 10 MG tablet Take 1 tablet (10 mg total) by mouth daily. 30 tablet 3   No current facility-administered medications on file prior to visit.    No Known Allergies  Social History   Socioeconomic History  . Marital status: Significant Other    Spouse name: Not on file  . Number of children: Not on file  . Years of education: Not on file  . Highest education level: Not on file  Occupational History  . Not on file  Tobacco Use  . Smoking status: Current Some Day Smoker    Packs/day: 0.30    Years: 5.00    Pack years: 1.50    Types: Cigarettes  . Smokeless tobacco: Never Used  Substance and Sexual Activity  . Alcohol use: Yes    Comment: weekends  . Drug use: Not Currently    Types: Marijuana    Comment: last useDec 1 2015  . Sexual activity: Yes    Birth control/protection: None  Other Topics Concern  . Not on file  Social History Narrative  . Not on file    Social Determinants of Health   Financial Resource Strain:   . Difficulty of Paying Living Expenses: Not on file  Food Insecurity:   . Worried About Programme researcher, broadcasting/film/video in the Last Year: Not on file  . Ran Out of Food in the Last Year: Not on file  Transportation Needs:   . Lack of Transportation (Medical): Not on file  . Lack of Transportation (Non-Medical): Not on file  Physical Activity:   . Days of Exercise per Week: Not on file  . Minutes of Exercise per Session: Not on file  Stress:   . Feeling of Stress : Not on file  Social Connections:   . Frequency of Communication with Friends and Family: Not on file  . Frequency of Social Gatherings with Friends and Family: Not on file  . Attends Religious Services: Not on file  . Active Member of Clubs or Organizations: Not on file  . Attends Banker Meetings: Not on file  . Marital Status: Not on file  Intimate Partner Violence:   . Fear of Current or Ex-Partner: Not on  file  . Emotionally Abused: Not on file  . Physically Abused: Not on file  . Sexually Abused: Not on file    Family History  Problem Relation Age of Onset  . Lupus Mother   . Other Mother        prediabetes  . Diabetes Father        B leg amputation  . Kidney disease Father   . Diabetes Paternal Grandmother   . Hypertension Paternal Grandmother   . Diabetes Paternal Grandfather   . Hypertension Paternal Grandfather   . Diabetes Cousin   . Hyperlipidemia Cousin     Past Surgical History:  Procedure Laterality Date  . LAPAROSCOPY N/A 05/16/2017   Procedure: LAPAROSCOPY DIAGNOSTIC;  Surgeon: Tereso Newcomer, MD;  Location: [facility];  Service: Gynecology;  Laterality: N/A;  . LAPAROTOMY N/A 05/16/2017   Procedure: EXPLORATORY LAPAROTOMY, LEFT CORNUAL WEDGE RESECTION ECTOPIC PREGNANCY;  Surgeon: Tereso Newcomer, MD;  Location: [facility];  Service: Gynecology;  Laterality: N/A;  . UNILATERAL SALPINGECTOMY Left 05/16/2017   Procedure: UNILATERAL  SALPINGECTOMY;  Surgeon: Tereso Newcomer, MD;  Location: [facility];  Service: Gynecology;  Laterality: Left;  . WISDOM TOOTH EXTRACTION      ROS: Review of Systems  Constitutional: Negative for chills, diaphoresis, fever and malaise/fatigue.  HENT: Negative for sore throat.   Eyes: Negative for blurred vision and pain.  Respiratory: Positive for shortness of breath. Negative for cough and wheezing.   Cardiovascular: Positive for chest pain and palpitations. Negative for leg swelling.  Gastrointestinal: Negative for nausea and vomiting.  Neurological: Positive for headaches. Negative for dizziness, tingling, tremors and sensory change.  Psychiatric/Behavioral: Negative for depression, memory loss and suicidal ideas. The patient is nervous/anxious and has insomnia.    Negative except as stated above  PHYSICAL EXAM: LMP 08/30/2018   Physical Exam General appearance - oriented to person, place, and time and anxious Mental status - alert, oriented to person, place, and time, depressed mood, anxious Eyes - pupils equal and reactive, extraocular eye movements intact Ears - bilateral TM's and external ear canals normal Mouth - mucous membranes moist, pharynx normal without lesions Chest - clear to auscultation, no wheezes, rales or rhonchi, symmetric air entry, no tachypnea, retractions or cyanosis Heart - normal rate, regular rhythm, normal S1, S2, no murmurs, rubs, clicks or gallops Neurological - alert, oriented, normal speech, no focal findings or movement disorder noted, screening mental status exam normal, cranial nerves II through XII intact, DTR's normal and symmetric, normal muscle tone, no tremors, strength 5/5  Vitals:   04/19/19 1357  Resp: 18  Height: 5\' 8"  (1.727 m)  Weight: 188 lb (85.3 kg)  SpO2: 98%  BMI (Calculated): 28.59   Filed Weights   04/19/19 1357  Weight: 188 lb (85.3 kg)    CMP Latest Ref Rng & Units 10/11/2018 05/21/2017 05/17/2017  Glucose 70 - 99 mg/dL 90  96 05/19/2017)  BUN 6 - 20 mg/dL 9 10 7   Creatinine 0.44 - 1.00 mg/dL 751(W 2.58  Sodium 135 - 145 mmol/L 131(L) 136 130(L)  Potassium 3.5 - 5.1 mmol/L 3.5 3.8 3.9  Chloride 98 - 111 mmol/L 100 105 101  CO2 22 - 32 mmol/L 22 24 24   Calcium 8.9 - 10.3 mg/dL 5.27) 9.2 7.82)  Total Protein 6.5 - 8.1 g/dL 7.0 - 6.5  Total Bilirubin 0.3 - 1.2 mg/dL 0.6 - 0.5  Alkaline Phos 38 - 126 U/L 59 - 45  AST 15 - 41 U/L  18 - 19  ALT 0 - 44 U/L 24 - 16   Lipid Panel  No results found for: CHOL, TRIG, HDL, CHOLHDL, VLDL, LDLCALC, LDLDIRECT  CBC    Component Value Date/Time   WBC 6.4 10/11/2018 1323   RBC 4.50 10/11/2018 1323   HGB 14.0 10/11/2018 1323   HCT 41.2 10/11/2018 1323   PLT 285 10/11/2018 1323   MCV 91.6 10/11/2018 1323   MCH 31.1 10/11/2018 1323   MCHC 34.0 10/11/2018 1323   RDW 11.6 10/11/2018 1323   LYMPHSABS 2.1 05/16/2017 1855   MONOABS 0.3 05/16/2017 1855   EOSABS 0.0 05/16/2017 1855   BASOSABS 0.0 05/16/2017 1855    ASSESSMENT AND PLAN: 1. ANXIETY:  Considering that Escitalopram (LEXAPRO) is causing side effects of which the patient is unhappy with the medication will be discontinued for further use. Patient willing to give Buspar another try. Explained to patient that Buspirone should be taken daily at the same time without missing doses in order to acquire a therapeutic effect of the medication. Buspirone should be taken even if she feels well. Buspirone should not be stopped abruptly in order to prevent rebound anxiety and may take from 4 to 6 weeks for optimal results. Patient reconsidered receiving assistance from psychiatrist. Referral for psychiatrist will be placed pending approval from patient's insurance company. Patient agrees to call health insurance company to see if Employee Assistance Programs for counseling is available or mental health services are covered under her plan. If patient finds that she can receive psychiatrist services with her health insurance plan  she agrees to call a psychiatrist within her network to begin services.  -Buspirone (BUSPAR); Take 1 tablet (7.5 mg total) by mouth 2 (two) times daily. Dispense: 180 tablets; Refills: 1  2. DIABETES MELLITUS SCREENING:  Hemoglobin A1C ordered, results pending.   3. SCREENING FOR HYPOTHYROIDISM:  TSH ordered, results pending. Hypothyroidism may increase clinical manifestations of anxiety.    FOLLOW-UP with attending physician in 3 months or sooner if needed. Patient was given clear instructions to go to Emergency Department or return to medical center if symptoms don't improve, worsen, or new problems develop.The patient verbalized understanding.  Patient was given the opportunity to ask questions.  Patient verbalized understanding of the plan and was able to repeat key elements of the plan.   No orders of the defined types were placed in this encounter.    Requested Prescriptions    No prescriptions requested or ordered in this encounter    No follow-ups on file.  Camillia Herter, NP

## 2019-04-20 ENCOUNTER — Telehealth: Payer: Self-pay | Admitting: *Deleted

## 2019-04-20 LAB — BASIC METABOLIC PANEL
BUN/Creatinine Ratio: 19 (ref 9–23)
BUN: 13 mg/dL (ref 6–20)
CO2: 23 mmol/L (ref 20–29)
Calcium: 9.7 mg/dL (ref 8.7–10.2)
Chloride: 103 mmol/L (ref 96–106)
Creatinine, Ser: 0.69 mg/dL (ref 0.57–1.00)
GFR calc Af Amer: 142 mL/min/{1.73_m2} (ref >59)
GFR calc non Af Amer: 123 mL/min/{1.73_m2} (ref >59)
Glucose: 75 mg/dL (ref 65–99)
Potassium: 4.3 mmol/L (ref 3.5–5.2)
Sodium: 139 mmol/L (ref 134–144)

## 2019-04-20 LAB — HEMOGLOBIN A1C
Est. average glucose Bld gHb Est-mCnc: 103 mg/dL
Hgb A1c MFr Bld: 5.2 % (ref 4.8–5.6)

## 2019-04-20 LAB — TSH: TSH: 1.17 u[IU]/mL (ref 0.450–4.500)

## 2019-04-20 NOTE — Telephone Encounter (Signed)
Patient verified DOB Patient is aware of all labs being normal and to follow up at the next visit.

## 2019-04-20 NOTE — Telephone Encounter (Signed)
-----   Message from Rema Fendt, NP sent at 04/20/2019 10:49 AM EST ----- Thyroid stimulating hormone result is 1.170 and within normal limit (0.450-4.500 uIU/mL).   Hemoglobin A1C result is 5.2 and within normal limit ( 4.8 - 5.6 %).  Basic metabolic panel results are within normal limit.  Results are visible to patient in MyChart.

## 2019-04-20 NOTE — Progress Notes (Signed)
Thyroid stimulating hormone result is 1.170 and within normal limit (0.450-4.500 uIU/mL).   Hemoglobin A1C result is 5.2 and within normal limit ( 4.8 - 5.6 %).  Basic metabolic panel results are within normal limit.  Results are visible to patient in MyChart.

## 2019-07-20 ENCOUNTER — Encounter: Payer: Self-pay | Admitting: Internal Medicine

## 2019-07-20 ENCOUNTER — Telehealth (INDEPENDENT_AMBULATORY_CARE_PROVIDER_SITE_OTHER): Payer: No Typology Code available for payment source | Admitting: Internal Medicine

## 2019-07-20 DIAGNOSIS — F411 Generalized anxiety disorder: Secondary | ICD-10-CM

## 2019-07-20 DIAGNOSIS — F329 Major depressive disorder, single episode, unspecified: Secondary | ICD-10-CM

## 2019-07-20 DIAGNOSIS — F32A Depression, unspecified: Secondary | ICD-10-CM

## 2019-07-20 MED ORDER — SERTRALINE HCL 25 MG PO TABS: ORAL_TABLET | ORAL | 1 refills | Status: DC

## 2019-07-20 NOTE — Progress Notes (Signed)
Virtual Visit via Telephone Note  I connected with Sara Rubio, on 07/20/2019 at 9:01 AM by telephone due to the COVID-19 pandemic and verified that I am speaking with the correct person using two identifiers.   Consent: I discussed the limitations, risks, security and privacy concerns of performing an evaluation and management service by telephone and the availability of in person appointments. I also discussed with the patient that there may be a patient responsible charge related to this service. The patient expressed understanding and agreed to proceed.   Location of Patient: Home   Location of Provider: Clinic    Persons participating in Telemedicine visit: Lynix R Areyanna Figeroa [facility] Dr. Earlene Plater      History of Present Illness: Patient has a f/u for anxiety and depression. She reports that she is feeling a little better. But does say that she is only taking her Buspar about once per day to every few days due to it causing headaches. She did try to take it twice per day for a couple months and was still having symptoms.   Patient does feel like she is doing better with her anxiety. She does struggle with calming down and mind racing. Has been meditating.   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 She is agreeable to me placing a referral.   She denies any SI. No safety concerns per patient.   GAD 7 : Generalized Anxiety Score 07/20/2019 04/19/2019 03/08/2019  Nervous, Anxious, on Edge 1 3 3   Control/stop worrying 1 3 3   Worry too much - different things 1 3 3   Trouble relaxing 2 3 3   Restless 0 3 3  Easily annoyed or irritable 1 3 3   Afraid - awful might happen 0 3 3  Total GAD 7 Score 6 21 21    Depression screen [facility] 2/9 07/20/2019 04/19/2019 03/08/2019  Decreased Interest 1 1 0  Down, Depressed, Hopeless 1 1 0  PHQ - 2 Score 2 2 0  Altered sleeping 2 1 -  Tired, decreased energy 1 1 -  Change in appetite 0 1 -   Feeling bad or failure about yourself  1 1 -  Trouble concentrating 1 1 -  Moving slowly or fidgety/restless 0 1 -  Suicidal thoughts 0 1 -  PHQ-9 Score 7 9 -     Past Medical History:  Diagnosis Date  . Anxiety 05/17/2017  . Genital herpes   . Headache   . Left cornual ectopic pregnancy 05/16/2017   8 week viable cornual ruptured ectopic pregnancy with ~500 ml of hemoperitoneum -> wedge resection Needs cesarean deliveries at 37 weeks for any subsequent pregnancies; labor is absolutely contraindicated.  . s/p ELAP, left cornual wedge resection & salpingectomy for ruptured cornual ectopic pregnancy on 05/16/17 05/16/2017  . Seasonal allergies    No Known Allergies  No current outpatient medications on file prior to visit.   No current facility-administered medications on file prior to visit.    Observations/Objective: NAD. Speaking clearly.  Work of breathing normal.  Alert and oriented. Mood appropriate.   Assessment and Plan: 1. Generalized anxiety disorder 2. Depression, unspecified depression type PHQ-9 and GAD-7 scores are greatly improved. However patient is taking Buspar sporadically which may actually worsen acute periods of anxiety due to sudden discontinuation of medication. Recommended we stop this medication and try another. Will initiate Zoloft at 25 mg daily, increase to 50 mg daily if tolerating. Discussed that may take up to 6  week trial for optimal results. Also discussed that short acting medications such as benzodiazepines are not good for long term improvement in anxiety symptoms. Referral was placed for counseling services.  - Ambulatory referral to Psychology - sertraline (ZOLOFT) 25 MG tablet; Take one tablet daily. If tolerating, increase to two tablets in one week.  Dispense: 60 tablet; Refill: 1    Follow Up Instructions: Return in 6 weeks for anxiety/depression f/u, return for annual exam with PAP    I discussed the assessment and treatment plan with  the patient. The patient was provided an opportunity to ask questions and all were answered. The patient agreed with the plan and demonstrated an understanding of the instructions.   The patient was advised to call back or seek an in-person evaluation if the symptoms worsen or if the condition fails to improve as anticipated.     I provided 14 minutes total of non-face-to-face time during this encounter including median intraservice time, reviewing previous notes, investigations, ordering medications, medical decision making, coordinating care and patient verbalized understanding at the end of the visit.    Phill Myron, D.O. Primary Care at [facility]  07/20/2019, 9:01 AM

## 2019-08-04 ENCOUNTER — Other Ambulatory Visit: Payer: Self-pay

## 2019-08-04 DIAGNOSIS — F411 Generalized anxiety disorder: Secondary | ICD-10-CM

## 2019-08-04 DIAGNOSIS — F32A Depression, unspecified: Secondary | ICD-10-CM

## 2019-08-04 MED ORDER — SERTRALINE HCL 25 MG PO TABS: ORAL_TABLET | ORAL | 0 refills | Status: AC

## 2019-09-08 ENCOUNTER — Telehealth: Payer: Self-pay

## 2019-09-08 NOTE — Telephone Encounter (Signed)

## 2019-09-09 ENCOUNTER — Encounter: Payer: No Typology Code available for payment source | Admitting: Internal Medicine

## 2019-09-22 DIAGNOSIS — Z419 Encounter for procedure for purposes other than remedying health state, unspecified: Secondary | ICD-10-CM | POA: Diagnosis not present

## 2019-09-30 DIAGNOSIS — L700 Acne vulgaris: Secondary | ICD-10-CM | POA: Diagnosis not present

## 2019-09-30 DIAGNOSIS — B009 Herpesviral infection, unspecified: Secondary | ICD-10-CM | POA: Diagnosis not present

## 2019-09-30 DIAGNOSIS — F419 Anxiety disorder, unspecified: Secondary | ICD-10-CM | POA: Diagnosis not present

## 2019-09-30 DIAGNOSIS — E559 Vitamin D deficiency, unspecified: Secondary | ICD-10-CM | POA: Diagnosis not present

## 2019-10-14 ENCOUNTER — Encounter: Payer: No Typology Code available for payment source | Admitting: Internal Medicine

## 2019-10-14 DIAGNOSIS — E559 Vitamin D deficiency, unspecified: Secondary | ICD-10-CM | POA: Diagnosis not present

## 2019-10-14 DIAGNOSIS — F419 Anxiety disorder, unspecified: Secondary | ICD-10-CM | POA: Diagnosis not present

## 2019-10-14 DIAGNOSIS — R102 Pelvic and perineal pain: Secondary | ICD-10-CM | POA: Diagnosis not present

## 2019-10-14 DIAGNOSIS — L700 Acne vulgaris: Secondary | ICD-10-CM | POA: Diagnosis not present

## 2019-10-14 DIAGNOSIS — B009 Herpesviral infection, unspecified: Secondary | ICD-10-CM | POA: Diagnosis not present

## 2019-10-23 DIAGNOSIS — Z419 Encounter for procedure for purposes other than remedying health state, unspecified: Secondary | ICD-10-CM | POA: Diagnosis not present

## 2019-11-23 DIAGNOSIS — Z419 Encounter for procedure for purposes other than remedying health state, unspecified: Secondary | ICD-10-CM | POA: Diagnosis not present

## 2019-12-23 DIAGNOSIS — Z419 Encounter for procedure for purposes other than remedying health state, unspecified: Secondary | ICD-10-CM | POA: Diagnosis not present

## 2019-12-30 DIAGNOSIS — R87610 Atypical squamous cells of undetermined significance on cytologic smear of cervix (ASC-US): Secondary | ICD-10-CM | POA: Diagnosis not present

## 2019-12-30 DIAGNOSIS — Z01419 Encounter for gynecological examination (general) (routine) without abnormal findings: Secondary | ICD-10-CM | POA: Diagnosis not present

## 2019-12-30 DIAGNOSIS — Z8759 Personal history of other complications of pregnancy, childbirth and the puerperium: Secondary | ICD-10-CM | POA: Diagnosis not present

## 2019-12-30 DIAGNOSIS — Z124 Encounter for screening for malignant neoplasm of cervix: Secondary | ICD-10-CM | POA: Diagnosis not present

## 2019-12-30 DIAGNOSIS — F419 Anxiety disorder, unspecified: Secondary | ICD-10-CM | POA: Diagnosis not present

## 2019-12-30 DIAGNOSIS — Z1389 Encounter for screening for other disorder: Secondary | ICD-10-CM | POA: Diagnosis not present

## 2019-12-30 DIAGNOSIS — E669 Obesity, unspecified: Secondary | ICD-10-CM | POA: Diagnosis not present

## 2020-01-17 DIAGNOSIS — E559 Vitamin D deficiency, unspecified: Secondary | ICD-10-CM | POA: Diagnosis not present

## 2020-01-17 DIAGNOSIS — L700 Acne vulgaris: Secondary | ICD-10-CM | POA: Diagnosis not present

## 2020-01-17 DIAGNOSIS — B009 Herpesviral infection, unspecified: Secondary | ICD-10-CM | POA: Diagnosis not present

## 2020-01-17 DIAGNOSIS — F419 Anxiety disorder, unspecified: Secondary | ICD-10-CM | POA: Diagnosis not present

## 2020-01-23 DIAGNOSIS — Z419 Encounter for procedure for purposes other than remedying health state, unspecified: Secondary | ICD-10-CM | POA: Diagnosis not present

## 2020-02-22 DIAGNOSIS — Z419 Encounter for procedure for purposes other than remedying health state, unspecified: Secondary | ICD-10-CM | POA: Diagnosis not present

## 2020-03-24 DIAGNOSIS — Z419 Encounter for procedure for purposes other than remedying health state, unspecified: Secondary | ICD-10-CM | POA: Diagnosis not present

## 2020-04-03 DIAGNOSIS — L700 Acne vulgaris: Secondary | ICD-10-CM | POA: Diagnosis not present

## 2020-04-03 DIAGNOSIS — F419 Anxiety disorder, unspecified: Secondary | ICD-10-CM | POA: Diagnosis not present

## 2020-04-03 DIAGNOSIS — Z113 Encounter for screening for infections with a predominantly sexual mode of transmission: Secondary | ICD-10-CM | POA: Diagnosis not present

## 2020-04-03 DIAGNOSIS — E559 Vitamin D deficiency, unspecified: Secondary | ICD-10-CM | POA: Diagnosis not present

## 2020-04-03 DIAGNOSIS — B009 Herpesviral infection, unspecified: Secondary | ICD-10-CM | POA: Diagnosis not present

## 2020-04-03 DIAGNOSIS — Z20822 Contact with and (suspected) exposure to covid-19: Secondary | ICD-10-CM | POA: Diagnosis not present

## 2020-04-24 DIAGNOSIS — Z419 Encounter for procedure for purposes other than remedying health state, unspecified: Secondary | ICD-10-CM | POA: Diagnosis not present

## 2020-05-08 DIAGNOSIS — E559 Vitamin D deficiency, unspecified: Secondary | ICD-10-CM | POA: Diagnosis not present

## 2020-05-08 DIAGNOSIS — L700 Acne vulgaris: Secondary | ICD-10-CM | POA: Diagnosis not present

## 2020-05-08 DIAGNOSIS — B009 Herpesviral infection, unspecified: Secondary | ICD-10-CM | POA: Diagnosis not present

## 2020-05-08 DIAGNOSIS — F419 Anxiety disorder, unspecified: Secondary | ICD-10-CM | POA: Diagnosis not present

## 2020-05-22 DIAGNOSIS — Z419 Encounter for procedure for purposes other than remedying health state, unspecified: Secondary | ICD-10-CM | POA: Diagnosis not present

## 2020-06-18 DIAGNOSIS — E559 Vitamin D deficiency, unspecified: Secondary | ICD-10-CM | POA: Diagnosis not present

## 2020-06-18 DIAGNOSIS — B009 Herpesviral infection, unspecified: Secondary | ICD-10-CM | POA: Diagnosis not present

## 2020-06-18 DIAGNOSIS — F419 Anxiety disorder, unspecified: Secondary | ICD-10-CM | POA: Diagnosis not present

## 2020-06-18 DIAGNOSIS — L700 Acne vulgaris: Secondary | ICD-10-CM | POA: Diagnosis not present

## 2020-06-22 DIAGNOSIS — Z419 Encounter for procedure for purposes other than remedying health state, unspecified: Secondary | ICD-10-CM | POA: Diagnosis not present

## 2020-07-17 DIAGNOSIS — L700 Acne vulgaris: Secondary | ICD-10-CM | POA: Diagnosis not present

## 2020-07-17 DIAGNOSIS — E559 Vitamin D deficiency, unspecified: Secondary | ICD-10-CM | POA: Diagnosis not present

## 2020-07-17 DIAGNOSIS — F419 Anxiety disorder, unspecified: Secondary | ICD-10-CM | POA: Diagnosis not present

## 2020-07-17 DIAGNOSIS — R040 Epistaxis: Secondary | ICD-10-CM | POA: Diagnosis not present

## 2020-07-17 DIAGNOSIS — B009 Herpesviral infection, unspecified: Secondary | ICD-10-CM | POA: Diagnosis not present

## 2020-07-22 DIAGNOSIS — Z419 Encounter for procedure for purposes other than remedying health state, unspecified: Secondary | ICD-10-CM | POA: Diagnosis not present

## 2020-07-26 DIAGNOSIS — F419 Anxiety disorder, unspecified: Secondary | ICD-10-CM | POA: Diagnosis not present

## 2020-07-26 DIAGNOSIS — E559 Vitamin D deficiency, unspecified: Secondary | ICD-10-CM | POA: Diagnosis not present

## 2020-07-26 DIAGNOSIS — R040 Epistaxis: Secondary | ICD-10-CM | POA: Diagnosis not present

## 2020-07-26 DIAGNOSIS — B009 Herpesviral infection, unspecified: Secondary | ICD-10-CM | POA: Diagnosis not present

## 2020-07-26 DIAGNOSIS — J22 Unspecified acute lower respiratory infection: Secondary | ICD-10-CM | POA: Diagnosis not present

## 2020-07-26 DIAGNOSIS — L700 Acne vulgaris: Secondary | ICD-10-CM | POA: Diagnosis not present

## 2020-07-30 IMAGING — US TRANSVAGINAL OB ULTRASOUND
1 series · 15 of 28 positions shown · non-contrast
Comparison: Early OB ultrasound yesterday.

CLINICAL DATA: 22-year-old who was diagnosed with a RIGHT adnexal
ectopic pregnancy yesterday and was given methotrexate. She presents
today with RIGHT-sided back pain. LMP 08/30/2018 ([DATE] weeks 1 day).
Quantitative beta hCG measured 891 yesterday. Personal history of
LEFT salpingectomy in 5132 for an ectopic pregnancy.

EXAM:
TRANSVAGINAL OB < 14 WEEKS ULTRASOUND
TECHNIQUE: Transvaginal ultrasound was performed for complete evaluation of the
gestation as well as the maternal uterus, adnexal regions, and
pelvic cul-de-sac.

[Series 1: transvaginal ob ultrasound · 30 acquisitions, 15 frames shown]
[im 1/30]
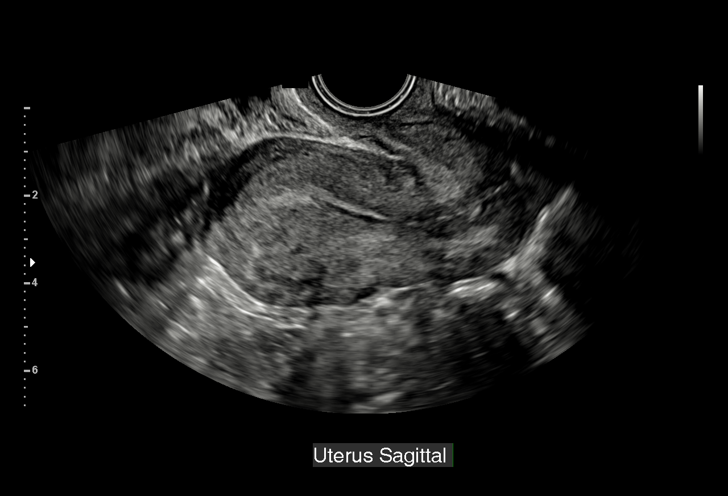
[im 3/30]
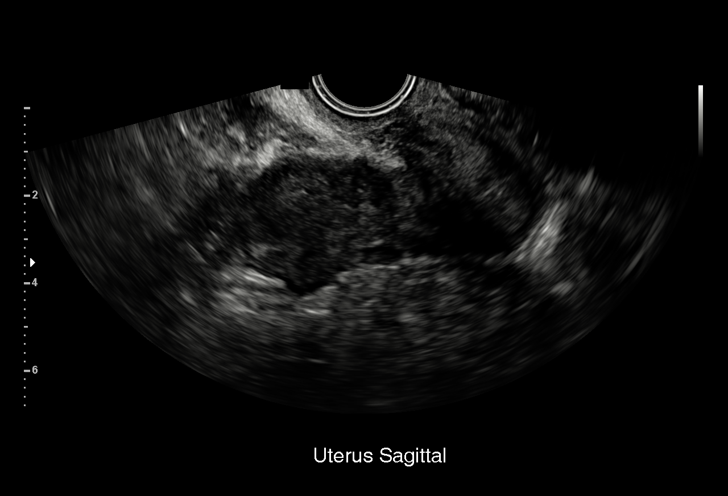
[im 5/30]
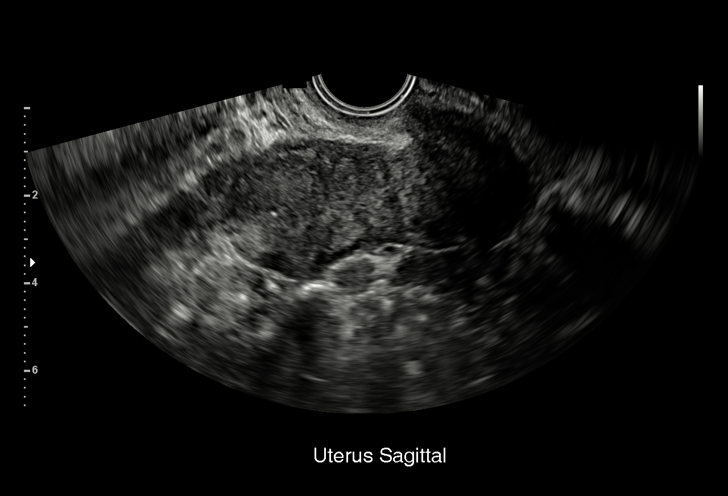
[im 7/30]
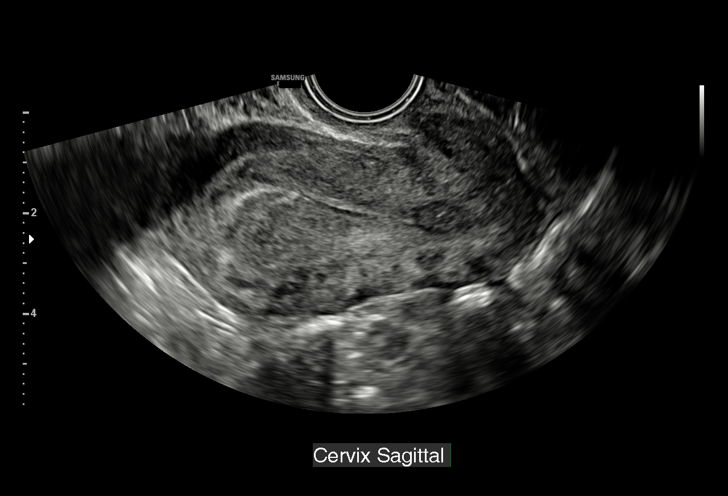
[im 9/30]
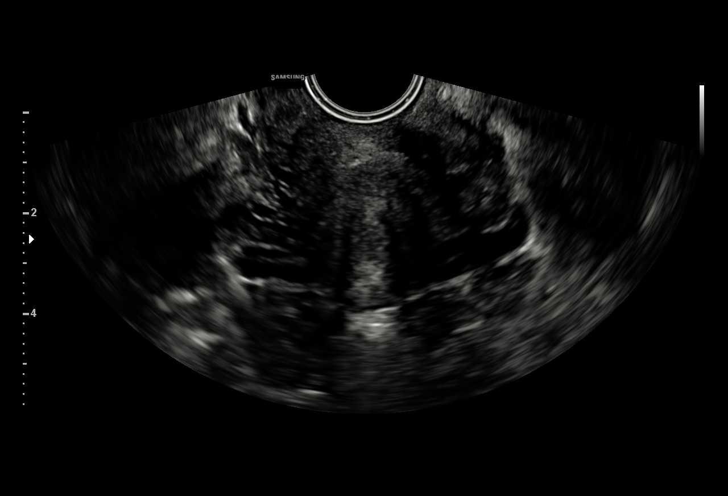
[im 11/30]
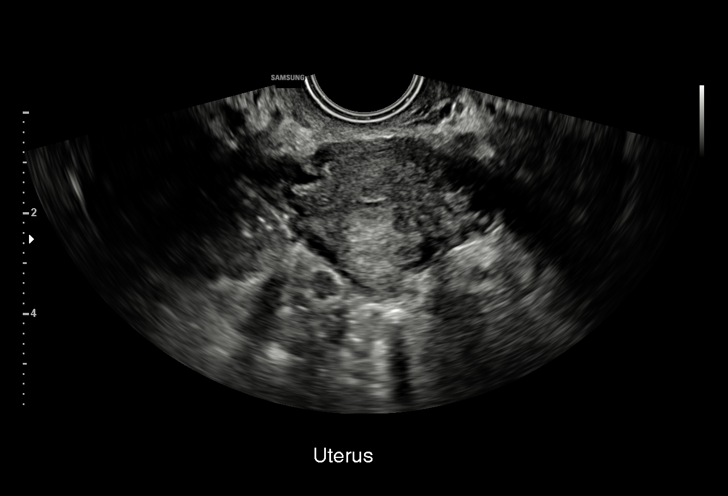
[im 13/30]
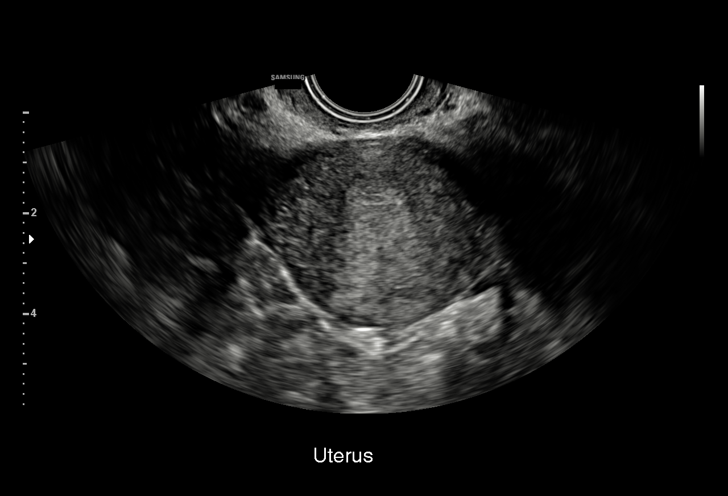
[im 16/30]
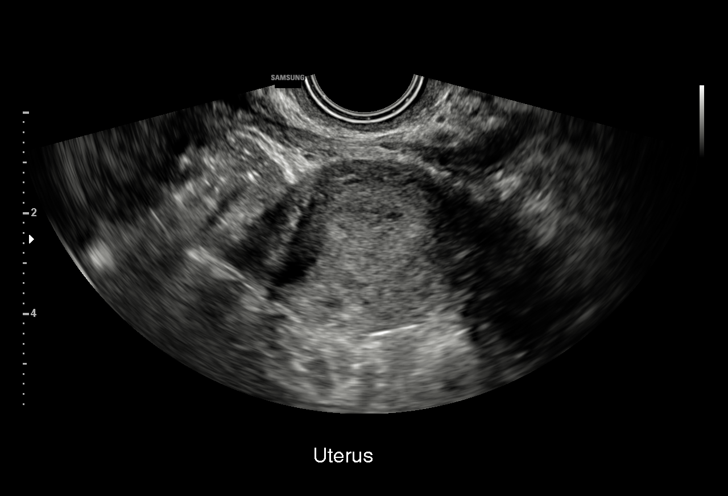
[im 17/30]
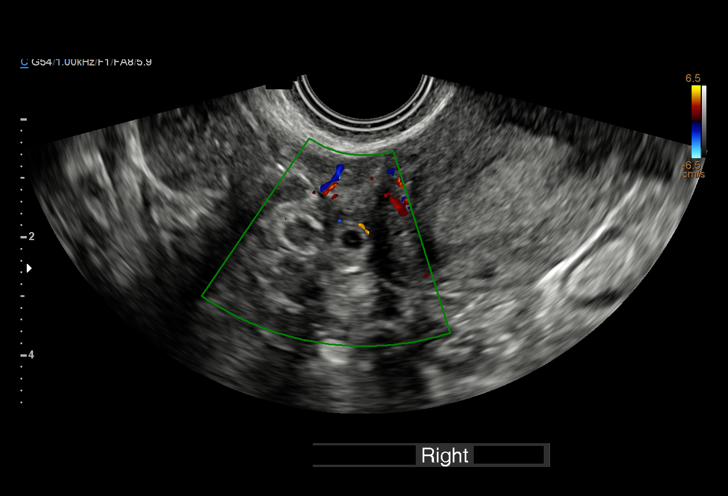
[im 19/30]
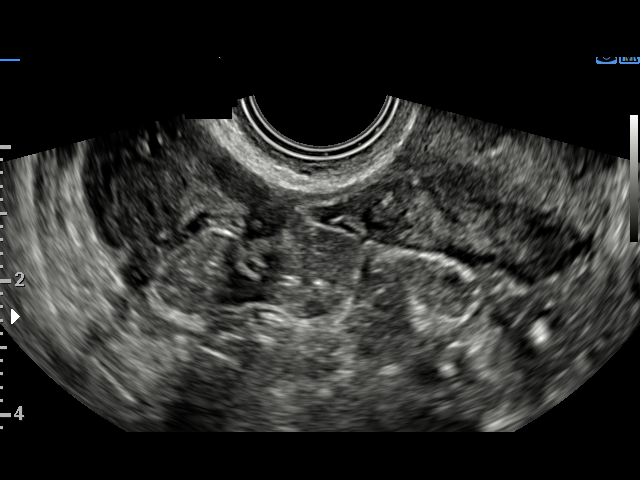
[im 21/30]
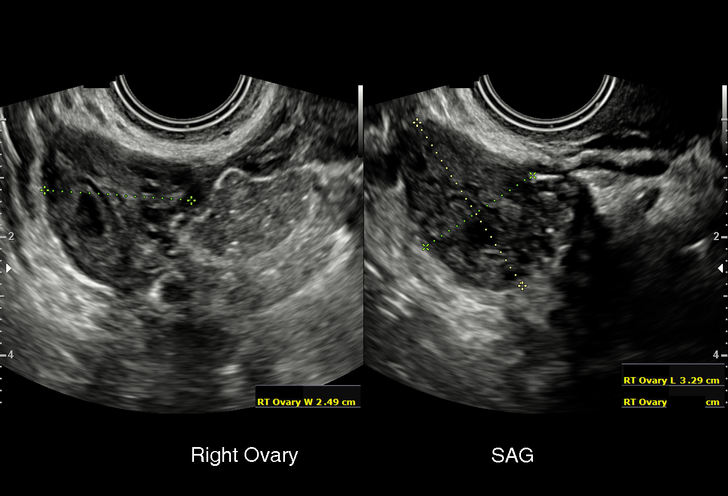
[im 23/30]
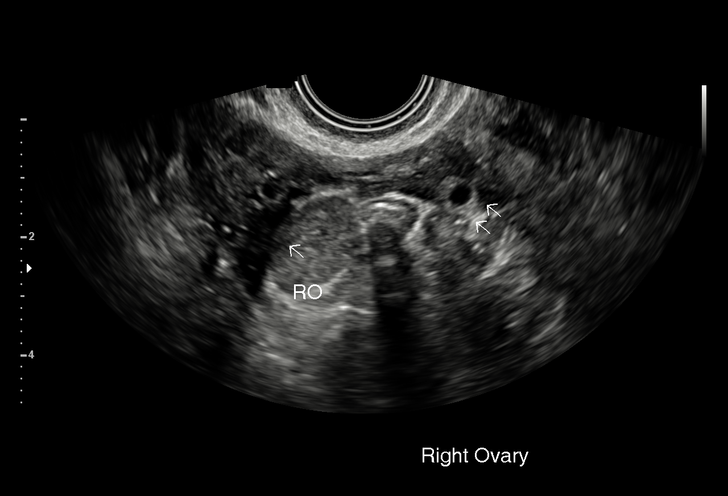
[im 25/30]
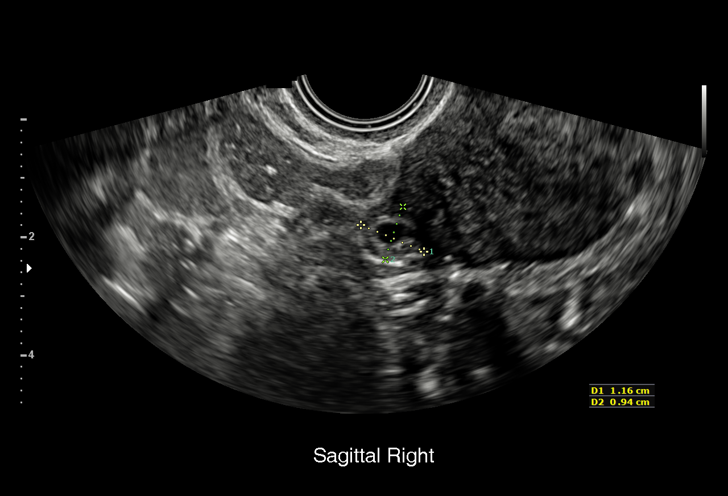
[im 27/30]
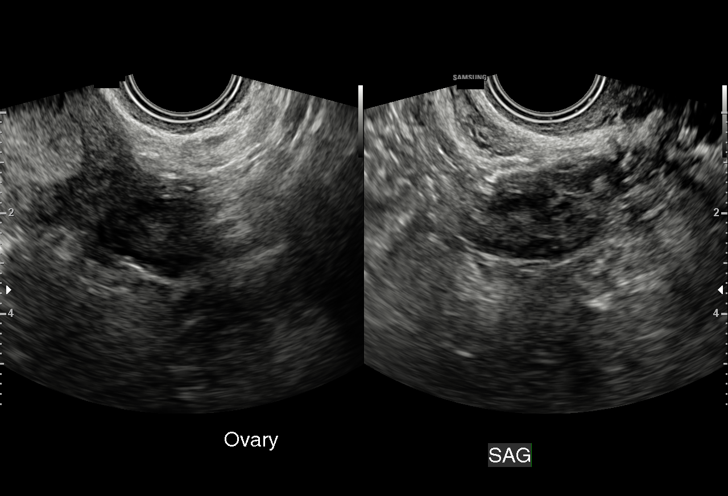
[im 30/30]
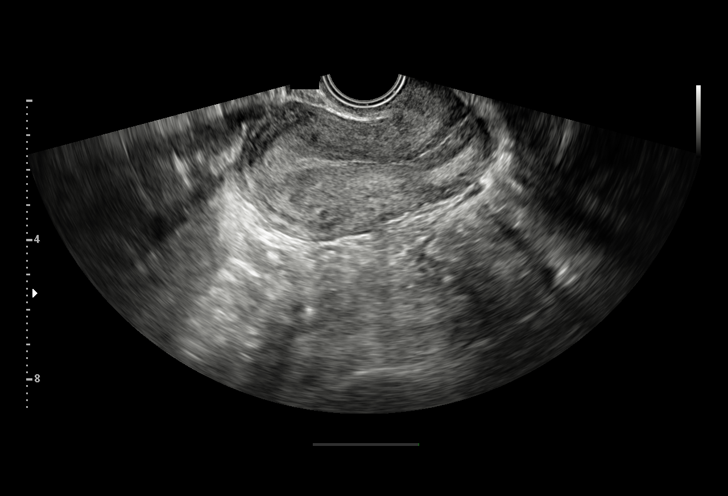

[15 of 28 positions shown; findings below may reference images not displayed]

FINDINGS: Intrauterine gestational sac: Absent.

Yolk sac:  Absent.

Embryo:  Absent.

Cardiac Activity: Absent.

US EDC: Not applicable.

Subchorionic hemorrhage:  Absent.

Maternal uterus/adnexae: Normal appearing nongravid uterus.
Approximate 8 mm mass in the RIGHT adnexum adjacent to the RIGHT
ovary with the appearance of a gestational sac, unchanged since
yesterday's examination. Normal-appearing RIGHT ovary measuring
approximately 2.5 x 3.3 x 2.2 cm. Normal-appearing LEFT ovary
measuring approximately 1.8 x 2.7 x 1.5 cm. No free pelvic fluid.
IMPRESSION: 1. No change since yesterday in the appearance of the RIGHT adnexal
ectopic.
2. No free pelvic fluid or blood to suggest rupture.
3. Normal-appearing uterus and ovaries.

## 2020-08-22 DIAGNOSIS — Z419 Encounter for procedure for purposes other than remedying health state, unspecified: Secondary | ICD-10-CM | POA: Diagnosis not present

## 2020-09-21 DIAGNOSIS — Z419 Encounter for procedure for purposes other than remedying health state, unspecified: Secondary | ICD-10-CM | POA: Diagnosis not present

## 2020-10-22 DIAGNOSIS — Z419 Encounter for procedure for purposes other than remedying health state, unspecified: Secondary | ICD-10-CM | POA: Diagnosis not present

## 2020-10-30 DIAGNOSIS — Z113 Encounter for screening for infections with a predominantly sexual mode of transmission: Secondary | ICD-10-CM | POA: Diagnosis not present

## 2020-10-30 DIAGNOSIS — B009 Herpesviral infection, unspecified: Secondary | ICD-10-CM | POA: Diagnosis not present

## 2020-10-30 DIAGNOSIS — L700 Acne vulgaris: Secondary | ICD-10-CM | POA: Diagnosis not present

## 2020-10-30 DIAGNOSIS — E559 Vitamin D deficiency, unspecified: Secondary | ICD-10-CM | POA: Diagnosis not present

## 2020-10-30 DIAGNOSIS — F419 Anxiety disorder, unspecified: Secondary | ICD-10-CM | POA: Diagnosis not present

## 2020-10-30 DIAGNOSIS — N3000 Acute cystitis without hematuria: Secondary | ICD-10-CM | POA: Diagnosis not present

## 2020-10-30 DIAGNOSIS — N939 Abnormal uterine and vaginal bleeding, unspecified: Secondary | ICD-10-CM | POA: Diagnosis not present

## 2020-11-22 DIAGNOSIS — Z419 Encounter for procedure for purposes other than remedying health state, unspecified: Secondary | ICD-10-CM | POA: Diagnosis not present

## 2020-11-28 DIAGNOSIS — L700 Acne vulgaris: Secondary | ICD-10-CM | POA: Diagnosis not present

## 2020-11-28 DIAGNOSIS — B009 Herpesviral infection, unspecified: Secondary | ICD-10-CM | POA: Diagnosis not present

## 2020-11-28 DIAGNOSIS — N939 Abnormal uterine and vaginal bleeding, unspecified: Secondary | ICD-10-CM | POA: Diagnosis not present

## 2020-11-28 DIAGNOSIS — E559 Vitamin D deficiency, unspecified: Secondary | ICD-10-CM | POA: Diagnosis not present

## 2020-11-28 DIAGNOSIS — F419 Anxiety disorder, unspecified: Secondary | ICD-10-CM | POA: Diagnosis not present

## 2020-12-16 ENCOUNTER — Other Ambulatory Visit: Payer: Self-pay

## 2020-12-16 ENCOUNTER — Emergency Department (HOSPITAL_BASED_OUTPATIENT_CLINIC_OR_DEPARTMENT_OTHER)
Admission: EM | Admit: 2020-12-16 | Discharge: 2020-12-16 | Disposition: A | Discharge status: Home / Self Care | Payer: Medicaid Other | Attending: Emergency Medicine | Admitting: Emergency Medicine

## 2020-12-16 ENCOUNTER — Encounter (HOSPITAL_BASED_OUTPATIENT_CLINIC_OR_DEPARTMENT_OTHER): Payer: Self-pay

## 2020-12-16 DIAGNOSIS — L040 Acute lymphadenitis of face, head and neck: Secondary | ICD-10-CM | POA: Diagnosis not present

## 2020-12-16 DIAGNOSIS — L232 Allergic contact dermatitis due to cosmetics: Secondary | ICD-10-CM | POA: Diagnosis not present

## 2020-12-16 DIAGNOSIS — F17210 Nicotine dependence, cigarettes, uncomplicated: Secondary | ICD-10-CM | POA: Insufficient documentation

## 2020-12-16 DIAGNOSIS — R591 Generalized enlarged lymph nodes: Secondary | ICD-10-CM | POA: Diagnosis not present

## 2020-12-16 DIAGNOSIS — T7840XA Allergy, unspecified, initial encounter: Secondary | ICD-10-CM | POA: Diagnosis present

## 2020-12-16 DIAGNOSIS — L243 Irritant contact dermatitis due to cosmetics: Secondary | ICD-10-CM

## 2020-12-16 DIAGNOSIS — R519 Headache, unspecified: Secondary | ICD-10-CM | POA: Diagnosis not present

## 2020-12-16 MED ORDER — NAPROXEN 500 MG PO TABS: 500.0000 mg | ORAL_TABLET | Freq: Two times a day (BID) | ORAL | 0 refills | Status: DC

## 2020-12-16 MED ORDER — NAPROXEN 250 MG PO TABS
500.0000 mg | ORAL_TABLET | Freq: Once | ORAL | Status: AC
  Administered 2020-12-16: 500 mg via ORAL
  Filled 2020-12-16: qty 2

## 2020-12-16 NOTE — ED Provider Notes (Signed)
[facility] EMERGENCY DEPT Provider Note   CSN: [id number] Arrival date & time: 12/16/20  2129     History Chief Complaint  Patient presents with   Allergic Reaction    Knots on head and swollen eye    Sara Rubio is a 25 y.o. female.  HPI     This a 25 year old female who presents with possible allergic reaction.  Patient reports that she died a week last week.  She had been wearing the woke up until several hours ago.  She states that she noted knots in her head and a headache.  She also noted some swelling of the right eye.  She has not noted any shortness of breath.  She does not take anything for her symptoms.  She has never had a reaction like this.  She rates headache 8 out of 10.  She reports tenderness over the posterior aspect of the head and neck related to "knots."  She has not noted any rash.  Past Medical History:  Diagnosis Date   Anxiety 05/17/2017   Genital herpes    Headache    Left cornual ectopic pregnancy 05/16/2017   8 week viable cornual ruptured ectopic pregnancy with ~500 ml of hemoperitoneum -> wedge resection Needs cesarean deliveries at 37 weeks for any subsequent pregnancies; labor is absolutely contraindicated.   s/p ELAP, left cornual wedge resection & salpingectomy for ruptured cornual ectopic pregnancy on 05/16/17 05/16/2017   Seasonal allergies     Patient Active Problem List   Diagnosis Date Noted   Generalized anxiety disorder 03/08/2019   Depression 03/08/2019   Acute pain of right knee 01/06/2019   Anxiety 05/17/2017   s/p ELAP, left cornual wedge resection & salpingectomy for ruptured cornual ectopic pregnancy on 05/16/17 05/16/2017   Genital herpes     Past Surgical History:  Procedure Laterality Date   LAPAROSCOPY N/A 05/16/2017   Procedure: LAPAROSCOPY DIAGNOSTIC;  Surgeon: Tereso Newcomer, MD;  Location: [facility];  Service: Gynecology;  Laterality: N/A;   LAPAROTOMY N/A 05/16/2017   Procedure: EXPLORATORY  LAPAROTOMY, LEFT CORNUAL WEDGE RESECTION ECTOPIC PREGNANCY;  Surgeon: Tereso Newcomer, MD;  Location: [facility];  Service: Gynecology;  Laterality: N/A;   UNILATERAL SALPINGECTOMY Left 05/16/2017   Procedure: UNILATERAL SALPINGECTOMY;  Surgeon: Tereso Newcomer, MD;  Location: [facility];  Service: Gynecology;  Laterality: Left;   WISDOM TOOTH EXTRACTION       OB History     Gravida  5   Para  1   Term  1   Preterm      AB  3   Living  1      SAB      IAB  2   Ectopic  1   Multiple  0   Live Births  1           Family History  Problem Relation Age of Onset   Lupus Mother    Other Mother        prediabetes   Diabetes Father        B leg amputation   Kidney disease Father    Diabetes Paternal Grandmother    Hypertension Paternal Grandmother    Diabetes Paternal Grandfather    Hypertension Paternal Grandfather    Diabetes Cousin    Hyperlipidemia Cousin     Social History   Tobacco Use   Smoking status: Some Days    Packs/day: 0.25    Years: 5.00    Pack years:  1.25    Types: Cigarettes   Smokeless tobacco: Never  Vaping Use   Vaping Use: Never used  Substance Use Topics   Alcohol use: Yes    Comment: weekends   Drug use: Not Currently    Types: Marijuana    Comment: last useDec 1 2015    Home Medications Prior to Admission medications   Medication Sig Start Date End Date Taking? Authorizing Provider  naproxen (NAPROSYN) 500 MG tablet Take 1 tablet (500 mg total) by mouth 2 (two) times daily. 12/16/20  Yes Tersea Aulds, Mayer Masker, MD  sertraline (ZOLOFT) 25 MG tablet Take one tablet daily. If tolerating, increase to two tablets in one week. 08/04/19   Arvilla Market, MD    Allergies    Patient has no known allergies.  Review of Systems   Review of Systems  Constitutional:  Negative for fever.  Respiratory:  Negative for shortness of breath.   Musculoskeletal:  Positive for neck pain. Negative for neck stiffness.  Skin:  Negative for  rash.  Hematological:  Positive for adenopathy.  All other systems reviewed and are negative.  Physical Exam Updated Vital Signs BP (!) 141/79   Pulse 91   Temp 98.4 F (36.9 C)   Resp 16   Ht 1.727 m (5\' 8" )   Wt 88 kg   LMP 11/22/2020   SpO2 100%   BMI 29.50 kg/m   Physical Exam Vitals and nursing note reviewed.  Constitutional:      Appearance: She is well-developed. She is not ill-appearing.  HENT:     Head: Normocephalic and atraumatic.     Comments: Occipital lymphadenopathy noted, slight tenderness palpation, mobile, no overlying skin changes, slightly raised papules noted over the front of the hairline, no erythema    Nose: Nose normal.     Mouth/Throat:     Mouth: Mucous membranes are moist.  Eyes:     Pupils: Pupils are equal, round, and reactive to light.     Comments: Slight swelling right eyelid, no overlying skin changes or erythema, extraocular movements intact  Cardiovascular:     Rate and Rhythm: Normal rate and regular rhythm.  Pulmonary:     Effort: Pulmonary effort is normal. No respiratory distress.  Abdominal:     Palpations: Abdomen is soft.  Musculoskeletal:     Cervical back: Neck supple.  Skin:    General: Skin is warm and dry.  Neurological:     Mental Status: She is alert and oriented to person, place, and time.  Psychiatric:        Mood and Affect: Mood normal.    ED Results / Procedures / Treatments   Labs (all labs ordered are listed, but only abnormal results are displayed) Labs Reviewed - No data to display  EKG None  Radiology No results found.  Procedures Procedures   Medications Ordered in ED Medications  naproxen (NAPROSYN) tablet 500 mg (has no administration in time range)    ED Course  I have reviewed the triage vital signs and the nursing notes.  Pertinent labs & imaging results that were available during my care of the patient were reviewed by me and considered in my medical decision making (see chart for  details).    MDM Rules/Calculators/A&P                           Patient presents with headache and knots in the posterior aspect of her neck and  head.  She is nontoxic and vital signs are reassuring.  She reports that she died a week and she believes that this has caused her to have reaction.  She does have some posterior occipital adenopathy as well as some fine papular rash around her hairline.  Likely irritant contact dermatitis.  She denies any infectious symptoms.  Headaches likely secondary to significant lymph node inflammation.  She has removed her awake several hours ago.  Recommended that she keep the week off and avoid dyes and detergents.  If she were to shampoo her hair, she needs to do so with a dye free shampoo.  This will likely improve on its own.  She may take Benadryl as needed given the swelling of the right.  No signs or symptoms of anaphylaxis.  After history, exam, and medical workup I feel the patient has been appropriately medically screened and is safe for discharge home. Pertinent diagnoses were discussed with the patient. Patient was given return precautions.  Final Clinical Impression(s) / ED Diagnoses Final diagnoses:  Irritant contact dermatitis due to cosmetics  Lymphadenopathy of head and neck    Rx / DC Orders ED Discharge Orders          Ordered    naproxen (NAPROSYN) 500 MG tablet  2 times daily        12/16/20 2345             Camil Hausmann, Mayer Masker, MD 12/16/20 2356

## 2020-12-16 NOTE — ED Notes (Signed)
Pt verbalizes understanding of discharge instructions. Opportunity for questioning and answers were provided. Armand removed by staff, pt discharged from ED to home. Instructed to take Benadryl when she gets home and pick up Rx.

## 2020-12-16 NOTE — Discharge Instructions (Addendum)
You were seen today and likely have a contact dermatitis type reaction to hair dye.  Take naproxen as needed for lymph node inflammation and headache.  Do not wear the culprit wig.  You may take Benadryl as needed.  This will likely improve on its own.  If you do wash her hair, make sure to wash it and dye free shampoo.

## 2020-12-16 NOTE — ED Triage Notes (Signed)
Patient states she colored a wig and put it on and now she has knots on her head and her right eye is swollen.

## 2020-12-22 DIAGNOSIS — Z419 Encounter for procedure for purposes other than remedying health state, unspecified: Secondary | ICD-10-CM | POA: Diagnosis not present

## 2021-01-15 DIAGNOSIS — L309 Dermatitis, unspecified: Secondary | ICD-10-CM | POA: Diagnosis not present

## 2021-01-15 DIAGNOSIS — L700 Acne vulgaris: Secondary | ICD-10-CM | POA: Diagnosis not present

## 2021-01-15 DIAGNOSIS — F419 Anxiety disorder, unspecified: Secondary | ICD-10-CM | POA: Diagnosis not present

## 2021-01-15 DIAGNOSIS — Z0000 Encounter for general adult medical examination without abnormal findings: Secondary | ICD-10-CM | POA: Diagnosis not present

## 2021-01-15 DIAGNOSIS — E559 Vitamin D deficiency, unspecified: Secondary | ICD-10-CM | POA: Diagnosis not present

## 2021-01-15 DIAGNOSIS — B009 Herpesviral infection, unspecified: Secondary | ICD-10-CM | POA: Diagnosis not present

## 2021-01-22 DIAGNOSIS — Z419 Encounter for procedure for purposes other than remedying health state, unspecified: Secondary | ICD-10-CM | POA: Diagnosis not present

## 2021-02-12 DIAGNOSIS — L700 Acne vulgaris: Secondary | ICD-10-CM | POA: Diagnosis not present

## 2021-02-12 DIAGNOSIS — T7840XA Allergy, unspecified, initial encounter: Secondary | ICD-10-CM | POA: Diagnosis not present

## 2021-02-12 DIAGNOSIS — F419 Anxiety disorder, unspecified: Secondary | ICD-10-CM | POA: Diagnosis not present

## 2021-02-12 DIAGNOSIS — L309 Dermatitis, unspecified: Secondary | ICD-10-CM | POA: Diagnosis not present

## 2021-02-12 DIAGNOSIS — B009 Herpesviral infection, unspecified: Secondary | ICD-10-CM | POA: Diagnosis not present

## 2021-02-12 DIAGNOSIS — E559 Vitamin D deficiency, unspecified: Secondary | ICD-10-CM | POA: Diagnosis not present

## 2021-02-20 DIAGNOSIS — E559 Vitamin D deficiency, unspecified: Secondary | ICD-10-CM | POA: Diagnosis not present

## 2021-02-20 DIAGNOSIS — F419 Anxiety disorder, unspecified: Secondary | ICD-10-CM | POA: Diagnosis not present

## 2021-02-20 DIAGNOSIS — L700 Acne vulgaris: Secondary | ICD-10-CM | POA: Diagnosis not present

## 2021-02-20 DIAGNOSIS — B009 Herpesviral infection, unspecified: Secondary | ICD-10-CM | POA: Diagnosis not present

## 2021-02-20 DIAGNOSIS — R591 Generalized enlarged lymph nodes: Secondary | ICD-10-CM | POA: Diagnosis not present

## 2021-02-21 DIAGNOSIS — Z419 Encounter for procedure for purposes other than remedying health state, unspecified: Secondary | ICD-10-CM | POA: Diagnosis not present

## 2021-03-21 ENCOUNTER — Encounter (HOSPITAL_BASED_OUTPATIENT_CLINIC_OR_DEPARTMENT_OTHER): Payer: Self-pay | Admitting: *Deleted

## 2021-03-21 ENCOUNTER — Emergency Department (HOSPITAL_BASED_OUTPATIENT_CLINIC_OR_DEPARTMENT_OTHER)
Admission: EM | Admit: 2021-03-21 | Discharge: 2021-03-21 | Disposition: A | Discharge status: Home / Self Care | Payer: Medicaid Other | Attending: Emergency Medicine | Admitting: Emergency Medicine

## 2021-03-21 ENCOUNTER — Other Ambulatory Visit: Payer: Self-pay

## 2021-03-21 DIAGNOSIS — R5383 Other fatigue: Secondary | ICD-10-CM | POA: Diagnosis not present

## 2021-03-21 DIAGNOSIS — Z87891 Personal history of nicotine dependence: Secondary | ICD-10-CM | POA: Insufficient documentation

## 2021-03-21 DIAGNOSIS — D72819 Decreased white blood cell count, unspecified: Secondary | ICD-10-CM | POA: Insufficient documentation

## 2021-03-21 DIAGNOSIS — Z20822 Contact with and (suspected) exposure to covid-19: Secondary | ICD-10-CM | POA: Insufficient documentation

## 2021-03-21 DIAGNOSIS — R21 Rash and other nonspecific skin eruption: Secondary | ICD-10-CM | POA: Insufficient documentation

## 2021-03-21 LAB — HIV ANTIBODY (ROUTINE TESTING W REFLEX): HIV Screen 4th Generation wRfx: NONREACTIVE

## 2021-03-21 LAB — URINALYSIS, ROUTINE W REFLEX MICROSCOPIC
Bilirubin Urine: NEGATIVE
Glucose, UA: NEGATIVE mg/dL
Ketones, ur: NEGATIVE mg/dL
Leukocytes,Ua: NEGATIVE
Nitrite: NEGATIVE
Protein, ur: 30 mg/dL — AB
Specific Gravity, Urine: 1.016 (ref 1.005–1.030)
pH: 7 (ref 5.0–8.0)

## 2021-03-21 LAB — CBC WITH DIFFERENTIAL/PLATELET
Abs Immature Granulocytes: 0.05 10*3/uL (ref 0.00–0.07)
Basophils Absolute: 0 10*3/uL (ref 0.0–0.1)
Basophils Relative: 1 %
Eosinophils Absolute: 0 10*3/uL (ref 0.0–0.5)
Eosinophils Relative: 0 %
HCT: 39.4 % (ref 36.0–46.0)
Hemoglobin: 13.3 g/dL (ref 12.0–15.0)
Immature Granulocytes: 2 %
Lymphocytes Relative: 24 %
Lymphs Abs: 0.6 10*3/uL — ABNORMAL LOW (ref 0.7–4.0)
MCH: 29.2 pg (ref 26.0–34.0)
MCHC: 33.8 g/dL (ref 30.0–36.0)
MCV: 86.4 fL (ref 80.0–100.0)
Monocytes Absolute: 0.2 10*3/uL (ref 0.1–1.0)
Monocytes Relative: 9 %
Neutro Abs: 1.7 10*3/uL (ref 1.7–7.7)
Neutrophils Relative %: 64 %
Platelets: 160 10*3/uL (ref 150–400)
RBC: 4.56 MIL/uL (ref 3.87–5.11)
RDW: 12.9 % (ref 11.5–15.5)
Smear Review: NORMAL
WBC: 2.6 10*3/uL — ABNORMAL LOW (ref 4.0–10.5)
nRBC: 0 % (ref 0.0–0.2)

## 2021-03-21 LAB — COMPREHENSIVE METABOLIC PANEL
ALT: 24 U/L (ref 0–44)
AST: 42 U/L — ABNORMAL HIGH (ref 15–41)
Albumin: 4.2 g/dL (ref 3.5–5.0)
Alkaline Phosphatase: 45 U/L (ref 38–126)
Anion gap: 8 (ref 5–15)
BUN: 8 mg/dL (ref 6–20)
CO2: 28 mmol/L (ref 22–32)
Calcium: 9.1 mg/dL (ref 8.9–10.3)
Chloride: 99 mmol/L (ref 98–111)
Creatinine, Ser: 0.7 mg/dL (ref 0.44–1.00)
GFR, Estimated: >60 mL/min (ref >60)
Glucose, Bld: 97 mg/dL (ref 70–99)
Potassium: 3.4 mmol/L — ABNORMAL LOW (ref 3.5–5.1)
Sodium: 135 mmol/L (ref 135–145)
Total Bilirubin: 0.6 mg/dL (ref 0.3–1.2)
Total Protein: 8.2 g/dL — ABNORMAL HIGH (ref 6.5–8.1)

## 2021-03-21 LAB — RESP PANEL BY RT-PCR (FLU A&B, COVID) ARPGX2
Influenza A by PCR: NEGATIVE
Influenza B by PCR: NEGATIVE
SARS Coronavirus 2 by RT PCR: NEGATIVE

## 2021-03-21 LAB — PREGNANCY, URINE: Preg Test, Ur: NEGATIVE

## 2021-03-21 LAB — TSH: TSH: 0.803 u[IU]/mL (ref 0.350–4.500)

## 2021-03-21 MED ORDER — ONDANSETRON HCL 4 MG/2ML IJ SOLN
4.0000 mg | Freq: Once | INTRAMUSCULAR | Status: AC
  Administered 2021-03-21: 11:00:00 4 mg via INTRAVENOUS
  Filled 2021-03-21: qty 2

## 2021-03-21 MED ORDER — SODIUM CHLORIDE 0.9 % IV BOLUS (SEPSIS)
1000.0000 mL | Freq: Once | INTRAVENOUS | Status: AC
  Administered 2021-03-21: 11:00:00 1000 mL via INTRAVENOUS

## 2021-03-21 NOTE — ED Provider Notes (Addendum)
[facility] EMERGENCY DEPT Provider Note   CSN: [id number] Arrival date & time: 03/21/21  3825     History Chief Complaint  Patient presents with   Fatigue   Ringworm    Sara Rubio is a 25 y.o. female.  HPI  Patient presents to the ED complaining of feeling fatigued.  Patient states symptoms started several days ago.  She has been sleeping a lot.  She feels like she has no energy.  Her appetite has been poor and she has had a couple episodes of nausea and vomiting.  The last was a few days ago.  Patient denies any fevers.  No diarrhea.  Patient also complains of a rash that has been ongoing for several weeks.  She thinks her nephew might have given her ringworm.  She has noticed several areas including her face and torso.  Past Medical History:  Diagnosis Date   Anxiety 05/17/2017   Genital herpes    Headache    Left cornual ectopic pregnancy 05/16/2017   8 week viable cornual ruptured ectopic pregnancy with ~500 ml of hemoperitoneum -> wedge resection Needs cesarean deliveries at 37 weeks for any subsequent pregnancies; labor is absolutely contraindicated.   s/p ELAP, left cornual wedge resection & salpingectomy for ruptured cornual ectopic pregnancy on 05/16/17 05/16/2017   Seasonal allergies     Patient Active Problem List   Diagnosis Date Noted   Generalized anxiety disorder 03/08/2019   Depression 03/08/2019   Acute pain of right knee 01/06/2019   Anxiety 05/17/2017   s/p ELAP, left cornual wedge resection & salpingectomy for ruptured cornual ectopic pregnancy on 05/16/17 05/16/2017   Genital herpes     Past Surgical History:  Procedure Laterality Date   LAPAROSCOPY N/A 05/16/2017   Procedure: LAPAROSCOPY DIAGNOSTIC;  Surgeon: Tereso Newcomer, MD;  Location: [facility];  Service: Gynecology;  Laterality: N/A;   LAPAROTOMY N/A 05/16/2017   Procedure: EXPLORATORY LAPAROTOMY, LEFT CORNUAL WEDGE RESECTION ECTOPIC PREGNANCY;  Surgeon: Tereso Newcomer,  MD;  Location: [facility];  Service: Gynecology;  Laterality: N/A;   UNILATERAL SALPINGECTOMY Left 05/16/2017   Procedure: UNILATERAL SALPINGECTOMY;  Surgeon: Tereso Newcomer, MD;  Location: [facility];  Service: Gynecology;  Laterality: Left;   WISDOM TOOTH EXTRACTION       OB History     Gravida  5   Para  1   Term  1   Preterm      AB  3   Living  1      SAB      IAB  2   Ectopic  1   Multiple  0   Live Births  1           Family History  Problem Relation Age of Onset   Lupus Mother    Other Mother        prediabetes   Diabetes Father        B leg amputation   Kidney disease Father    Diabetes Paternal Grandmother    Hypertension Paternal Grandmother    Diabetes Paternal Grandfather    Hypertension Paternal Grandfather    Diabetes Cousin    Hyperlipidemia Cousin     Social History   Tobacco Use   Smoking status: Former    Packs/day: 0.25    Years: 5.00    Pack years: 1.25    Types: Cigarettes   Smokeless tobacco: Never  Vaping Use   Vaping Use: Never used  Substance Use Topics  Alcohol use: Yes    Comment: weekends   Drug use: Not Currently    Types: Marijuana    Comment: last useDec 1 2015    Home Medications Prior to Admission medications   Medication Sig Start Date End Date Taking? Authorizing Provider  naproxen (NAPROSYN) 500 MG tablet Take 1 tablet (500 mg total) by mouth 2 (two) times daily. 12/16/20   Horton, Mayer Masker, MD  sertraline (ZOLOFT) 25 MG tablet Take one tablet daily. If tolerating, increase to two tablets in one week. 08/04/19   Arvilla Market, MD    Allergies    Patient has no known allergies.  Review of Systems   Review of Systems  All other systems reviewed and are negative.  Physical Exam Updated Vital Signs BP 112/61 (BP Location: Right Arm)    Pulse 96    Temp 99.8 F (37.7 C) (Oral)    Resp 16    Ht 1.727 m (5\' 8" )    LMP 03/20/2021    SpO2 96%    BMI 29.50 kg/m   Physical Exam Vitals and  nursing note reviewed.  Constitutional:      General: She is not in acute distress.    Appearance: She is well-developed.  HENT:     Head: Normocephalic and atraumatic.     Right Ear: External ear normal.     Left Ear: External ear normal.  Eyes:     General: No scleral icterus.       Right eye: No discharge.        Left eye: No discharge.     Conjunctiva/sclera: Conjunctivae normal.  Neck:     Trachea: No tracheal deviation.  Cardiovascular:     Rate and Rhythm: Normal rate and regular rhythm.  Pulmonary:     Effort: Pulmonary effort is normal. No respiratory distress.     Breath sounds: Normal breath sounds. No stridor. No wheezing or rales.  Abdominal:     General: Bowel sounds are normal. There is no distension.     Palpations: Abdomen is soft.     Tenderness: There is no abdominal tenderness. There is no guarding or rebound.  Musculoskeletal:        General: No tenderness or deformity.     Cervical back: Neck supple.  Skin:    General: Skin is warm and dry.     Findings: Rash present.     Comments: Maculopapular erythematous rash in patches noted in face chest and torso, no pustules, no vesicles  Neurological:     General: No focal deficit present.     Mental Status: She is alert.     Cranial Nerves: No cranial nerve deficit (no facial droop, extraocular movements intact, no slurred speech).     Sensory: No sensory deficit.     Motor: No abnormal muscle tone or seizure activity.     Coordination: Coordination normal.  Psychiatric:        Mood and Affect: Mood normal.    ED Results / Procedures / Treatments   Labs (all labs ordered are listed, but only abnormal results are displayed) Labs Reviewed  CBC WITH DIFFERENTIAL/PLATELET - Abnormal; Notable for the following components:      Result Value   WBC 2.6 (*)    Lymphs Abs 0.6 (*)    All other components within normal limits  COMPREHENSIVE METABOLIC PANEL - Abnormal; Notable for the following components:    Potassium 3.4 (*)    Total Protein 8.2 (*)  AST 42 (*)    All other components within normal limits  URINALYSIS, ROUTINE W REFLEX MICROSCOPIC - Abnormal; Notable for the following components:   Hgb urine dipstick MODERATE (*)    Protein, ur 30 (*)    All other components within normal limits  RESP PANEL BY RT-PCR (FLU A&B, COVID) ARPGX2  PREGNANCY, URINE  TSH  RPR  HIV ANTIBODY (ROUTINE TESTING W REFLEX)    EKG None  Radiology No results found.  Procedures Procedures   Medications Ordered in ED Medications  sodium chloride 0.9 % bolus 1,000 mL (0 mLs Intravenous Stopped 03/21/21 1144)  ondansetron (ZOFRAN) injection 4 mg (4 mg Intravenous Given 03/21/21 1033)    ED Course  I have reviewed the triage vital signs and the nursing notes.  Pertinent labs & imaging results that were available during my care of the patient were reviewed by me and considered in my medical decision making (see chart for details).    MDM Rules/Calculators/A&P                         Patient presented with complaints of fatigue.  Patient's ED work-up is reassuring with the exception of slight decrease in her Adderley blood cell count.  Otherwise no signs of severe dehydration.  No anemia.  No signs of UTI.  COVID and flu are negative.  TSH within the normal range.  Etiology of fatigue unclear.  Could be related to her decreased Vancleve blood cell count.  That will require repeat testing and follow-up.  Also added on HIV and RPR considering her rash although low suspicion.  Recommend follow-up with her PCP.  Also consider seeing dermatologist regarding the rash.  At this time no indications for hospitalization.    Final Clinical Impression(s) / ED Diagnoses Final diagnoses:  Fatigue, unspecified type  Leukopenia, unspecified type    Rx / DC Orders ED Discharge Orders     None        Linwood Dibbles, MD 03/21/21 1418    Linwood Dibbles, MD 03/21/21 [phone]

## 2021-03-21 NOTE — Discharge Instructions (Signed)
Follow-up with your primary care doctor to recheck your blood count and to follow-up on the additional blood tests that were sent today.

## 2021-03-21 NOTE — ED Triage Notes (Signed)
Couple of days ago vomited x 1-2 times, sleeping a lot, feels fatigue, appetite not as good. Also states she has possible ring worm on chest.

## 2021-03-22 LAB — RPR: RPR Ser Ql: NONREACTIVE

## 2021-03-24 DIAGNOSIS — Z419 Encounter for procedure for purposes other than remedying health state, unspecified: Secondary | ICD-10-CM | POA: Diagnosis not present

## 2021-04-24 DIAGNOSIS — Z419 Encounter for procedure for purposes other than remedying health state, unspecified: Secondary | ICD-10-CM | POA: Diagnosis not present

## 2021-04-30 DIAGNOSIS — E559 Vitamin D deficiency, unspecified: Secondary | ICD-10-CM | POA: Diagnosis not present

## 2021-04-30 DIAGNOSIS — B009 Herpesviral infection, unspecified: Secondary | ICD-10-CM | POA: Diagnosis not present

## 2021-04-30 DIAGNOSIS — F419 Anxiety disorder, unspecified: Secondary | ICD-10-CM | POA: Diagnosis not present

## 2021-04-30 DIAGNOSIS — L700 Acne vulgaris: Secondary | ICD-10-CM | POA: Diagnosis not present

## 2021-05-22 DIAGNOSIS — Z419 Encounter for procedure for purposes other than remedying health state, unspecified: Secondary | ICD-10-CM | POA: Diagnosis not present

## 2021-06-12 DIAGNOSIS — Z113 Encounter for screening for infections with a predominantly sexual mode of transmission: Secondary | ICD-10-CM | POA: Diagnosis not present

## 2021-06-12 DIAGNOSIS — F419 Anxiety disorder, unspecified: Secondary | ICD-10-CM | POA: Diagnosis not present

## 2021-06-12 DIAGNOSIS — E559 Vitamin D deficiency, unspecified: Secondary | ICD-10-CM | POA: Diagnosis not present

## 2021-06-12 DIAGNOSIS — B009 Herpesviral infection, unspecified: Secondary | ICD-10-CM | POA: Diagnosis not present

## 2021-06-12 DIAGNOSIS — L700 Acne vulgaris: Secondary | ICD-10-CM | POA: Diagnosis not present

## 2021-06-22 DIAGNOSIS — Z419 Encounter for procedure for purposes other than remedying health state, unspecified: Secondary | ICD-10-CM | POA: Diagnosis not present

## 2021-06-26 DIAGNOSIS — L700 Acne vulgaris: Secondary | ICD-10-CM | POA: Diagnosis not present

## 2021-06-26 DIAGNOSIS — B009 Herpesviral infection, unspecified: Secondary | ICD-10-CM | POA: Diagnosis not present

## 2021-06-26 DIAGNOSIS — F419 Anxiety disorder, unspecified: Secondary | ICD-10-CM | POA: Diagnosis not present

## 2021-06-26 DIAGNOSIS — E559 Vitamin D deficiency, unspecified: Secondary | ICD-10-CM | POA: Diagnosis not present

## 2021-07-22 DIAGNOSIS — Z419 Encounter for procedure for purposes other than remedying health state, unspecified: Secondary | ICD-10-CM | POA: Diagnosis not present

## 2021-08-07 DIAGNOSIS — N3000 Acute cystitis without hematuria: Secondary | ICD-10-CM | POA: Diagnosis not present

## 2021-08-07 DIAGNOSIS — Z113 Encounter for screening for infections with a predominantly sexual mode of transmission: Secondary | ICD-10-CM | POA: Diagnosis not present

## 2021-08-21 DIAGNOSIS — N946 Dysmenorrhea, unspecified: Secondary | ICD-10-CM | POA: Diagnosis not present

## 2021-08-22 DIAGNOSIS — Z419 Encounter for procedure for purposes other than remedying health state, unspecified: Secondary | ICD-10-CM | POA: Diagnosis not present

## 2021-09-12 DIAGNOSIS — L209 Atopic dermatitis, unspecified: Secondary | ICD-10-CM | POA: Diagnosis not present

## 2021-09-18 DIAGNOSIS — L2389 Allergic contact dermatitis due to other agents: Secondary | ICD-10-CM | POA: Diagnosis not present

## 2021-09-18 DIAGNOSIS — L299 Pruritus, unspecified: Secondary | ICD-10-CM | POA: Diagnosis not present

## 2021-09-18 DIAGNOSIS — J302 Other seasonal allergic rhinitis: Secondary | ICD-10-CM | POA: Diagnosis not present

## 2021-09-18 DIAGNOSIS — J3089 Other allergic rhinitis: Secondary | ICD-10-CM | POA: Diagnosis not present

## 2021-09-21 DIAGNOSIS — Z419 Encounter for procedure for purposes other than remedying health state, unspecified: Secondary | ICD-10-CM | POA: Diagnosis not present

## 2021-10-09 DIAGNOSIS — J302 Other seasonal allergic rhinitis: Secondary | ICD-10-CM | POA: Diagnosis not present

## 2021-10-09 DIAGNOSIS — J3089 Other allergic rhinitis: Secondary | ICD-10-CM | POA: Diagnosis not present

## 2021-10-09 DIAGNOSIS — H1013 Acute atopic conjunctivitis, bilateral: Secondary | ICD-10-CM | POA: Diagnosis not present

## 2021-10-09 DIAGNOSIS — L2389 Allergic contact dermatitis due to other agents: Secondary | ICD-10-CM | POA: Diagnosis not present

## 2021-10-09 DIAGNOSIS — T781XXS Other adverse food reactions, not elsewhere classified, sequela: Secondary | ICD-10-CM | POA: Diagnosis not present

## 2021-10-09 DIAGNOSIS — L299 Pruritus, unspecified: Secondary | ICD-10-CM | POA: Diagnosis not present

## 2021-10-21 DIAGNOSIS — J302 Other seasonal allergic rhinitis: Secondary | ICD-10-CM | POA: Diagnosis not present

## 2021-10-21 DIAGNOSIS — H1013 Acute atopic conjunctivitis, bilateral: Secondary | ICD-10-CM | POA: Diagnosis not present

## 2021-10-21 DIAGNOSIS — L299 Pruritus, unspecified: Secondary | ICD-10-CM | POA: Diagnosis not present

## 2021-10-21 DIAGNOSIS — L2389 Allergic contact dermatitis due to other agents: Secondary | ICD-10-CM | POA: Diagnosis not present

## 2021-10-21 DIAGNOSIS — T781XXS Other adverse food reactions, not elsewhere classified, sequela: Secondary | ICD-10-CM | POA: Diagnosis not present

## 2021-10-21 DIAGNOSIS — R590 Localized enlarged lymph nodes: Secondary | ICD-10-CM | POA: Diagnosis not present

## 2021-10-21 DIAGNOSIS — J3089 Other allergic rhinitis: Secondary | ICD-10-CM | POA: Diagnosis not present

## 2021-10-22 DIAGNOSIS — Z419 Encounter for procedure for purposes other than remedying health state, unspecified: Secondary | ICD-10-CM | POA: Diagnosis not present

## 2021-10-28 DIAGNOSIS — L219 Seborrheic dermatitis, unspecified: Secondary | ICD-10-CM | POA: Diagnosis not present

## 2021-10-28 DIAGNOSIS — L700 Acne vulgaris: Secondary | ICD-10-CM | POA: Diagnosis not present

## 2021-11-18 DIAGNOSIS — D485 Neoplasm of uncertain behavior of skin: Secondary | ICD-10-CM | POA: Diagnosis not present

## 2021-11-18 DIAGNOSIS — L219 Seborrheic dermatitis, unspecified: Secondary | ICD-10-CM | POA: Diagnosis not present

## 2021-11-18 DIAGNOSIS — L700 Acne vulgaris: Secondary | ICD-10-CM | POA: Diagnosis not present

## 2021-11-18 DIAGNOSIS — L930 Discoid lupus erythematosus: Secondary | ICD-10-CM | POA: Diagnosis not present

## 2021-11-18 DIAGNOSIS — R531 Weakness: Secondary | ICD-10-CM | POA: Diagnosis not present

## 2021-11-22 DIAGNOSIS — Z419 Encounter for procedure for purposes other than remedying health state, unspecified: Secondary | ICD-10-CM | POA: Diagnosis not present

## 2021-11-26 DIAGNOSIS — R300 Dysuria: Secondary | ICD-10-CM | POA: Diagnosis not present

## 2021-12-04 DIAGNOSIS — Z131 Encounter for screening for diabetes mellitus: Secondary | ICD-10-CM | POA: Diagnosis not present

## 2021-12-04 DIAGNOSIS — R3915 Urgency of urination: Secondary | ICD-10-CM | POA: Diagnosis not present

## 2021-12-04 DIAGNOSIS — Z13220 Encounter for screening for lipoid disorders: Secondary | ICD-10-CM | POA: Diagnosis not present

## 2021-12-04 DIAGNOSIS — N946 Dysmenorrhea, unspecified: Secondary | ICD-10-CM | POA: Diagnosis not present

## 2021-12-22 DIAGNOSIS — Z419 Encounter for procedure for purposes other than remedying health state, unspecified: Secondary | ICD-10-CM | POA: Diagnosis not present

## 2021-12-31 DIAGNOSIS — D72819 Decreased white blood cell count, unspecified: Secondary | ICD-10-CM | POA: Diagnosis not present

## 2021-12-31 DIAGNOSIS — N921 Excessive and frequent menstruation with irregular cycle: Secondary | ICD-10-CM | POA: Diagnosis not present

## 2021-12-31 DIAGNOSIS — Z113 Encounter for screening for infections with a predominantly sexual mode of transmission: Secondary | ICD-10-CM | POA: Diagnosis not present

## 2022-01-22 DIAGNOSIS — Z419 Encounter for procedure for purposes other than remedying health state, unspecified: Secondary | ICD-10-CM | POA: Diagnosis not present

## 2022-01-29 DIAGNOSIS — N946 Dysmenorrhea, unspecified: Secondary | ICD-10-CM | POA: Diagnosis not present

## 2022-01-29 DIAGNOSIS — D72819 Decreased white blood cell count, unspecified: Secondary | ICD-10-CM | POA: Diagnosis not present

## 2022-02-04 DIAGNOSIS — R102 Pelvic and perineal pain: Secondary | ICD-10-CM | POA: Diagnosis not present

## 2022-02-04 DIAGNOSIS — N83201 Unspecified ovarian cyst, right side: Secondary | ICD-10-CM | POA: Diagnosis not present

## 2022-02-04 DIAGNOSIS — N83202 Unspecified ovarian cyst, left side: Secondary | ICD-10-CM | POA: Diagnosis not present

## 2022-02-04 DIAGNOSIS — N946 Dysmenorrhea, unspecified: Secondary | ICD-10-CM | POA: Diagnosis not present

## 2022-02-04 DIAGNOSIS — Z30011 Encounter for initial prescription of contraceptive pills: Secondary | ICD-10-CM | POA: Diagnosis not present

## 2022-02-21 DIAGNOSIS — Z419 Encounter for procedure for purposes other than remedying health state, unspecified: Secondary | ICD-10-CM | POA: Diagnosis not present

## 2022-04-08 DIAGNOSIS — Z113 Encounter for screening for infections with a predominantly sexual mode of transmission: Secondary | ICD-10-CM | POA: Diagnosis not present

## 2022-04-08 DIAGNOSIS — Z1329 Encounter for screening for other suspected endocrine disorder: Secondary | ICD-10-CM | POA: Diagnosis not present

## 2022-04-08 DIAGNOSIS — Z131 Encounter for screening for diabetes mellitus: Secondary | ICD-10-CM | POA: Diagnosis not present

## 2022-04-08 DIAGNOSIS — N921 Excessive and frequent menstruation with irregular cycle: Secondary | ICD-10-CM | POA: Diagnosis not present

## 2022-04-08 DIAGNOSIS — N946 Dysmenorrhea, unspecified: Secondary | ICD-10-CM | POA: Diagnosis not present

## 2022-04-08 DIAGNOSIS — Z0001 Encounter for general adult medical examination with abnormal findings: Secondary | ICD-10-CM | POA: Diagnosis not present

## 2022-04-08 DIAGNOSIS — Z136 Encounter for screening for cardiovascular disorders: Secondary | ICD-10-CM | POA: Diagnosis not present

## 2022-04-26 ENCOUNTER — Inpatient Hospital Stay: Payer: Medicaid Other | Attending: Hematology and Oncology | Admitting: Hematology and Oncology

## 2022-04-26 ENCOUNTER — Inpatient Hospital Stay: Payer: Medicaid Other

## 2022-04-26 ENCOUNTER — Encounter: Payer: Self-pay | Admitting: Hematology and Oncology

## 2022-04-26 VITALS — BP 131/74 | HR 97 | Temp 97.5°F | Resp 20 | Wt 167.6 lb

## 2022-04-26 DIAGNOSIS — Z79899 Other long term (current) drug therapy: Secondary | ICD-10-CM | POA: Diagnosis not present

## 2022-04-26 DIAGNOSIS — Z841 Family history of disorders of kidney and ureter: Secondary | ICD-10-CM | POA: Insufficient documentation

## 2022-04-26 DIAGNOSIS — R5383 Other fatigue: Secondary | ICD-10-CM | POA: Insufficient documentation

## 2022-04-26 DIAGNOSIS — R21 Rash and other nonspecific skin eruption: Secondary | ICD-10-CM | POA: Diagnosis not present

## 2022-04-26 DIAGNOSIS — D72819 Decreased white blood cell count, unspecified: Secondary | ICD-10-CM | POA: Diagnosis not present

## 2022-04-26 DIAGNOSIS — Z87891 Personal history of nicotine dependence: Secondary | ICD-10-CM | POA: Insufficient documentation

## 2022-04-26 DIAGNOSIS — Z833 Family history of diabetes mellitus: Secondary | ICD-10-CM | POA: Diagnosis not present

## 2022-04-26 DIAGNOSIS — Z832 Family history of diseases of the blood and blood-forming organs and certain disorders involving the immune mechanism: Secondary | ICD-10-CM | POA: Diagnosis not present

## 2022-04-26 DIAGNOSIS — Z8349 Family history of other endocrine, nutritional and metabolic diseases: Secondary | ICD-10-CM | POA: Diagnosis not present

## 2022-04-26 DIAGNOSIS — Z9079 Acquired absence of other genital organ(s): Secondary | ICD-10-CM | POA: Insufficient documentation

## 2022-04-26 DIAGNOSIS — Z8249 Family history of ischemic heart disease and other diseases of the circulatory system: Secondary | ICD-10-CM | POA: Insufficient documentation

## 2022-04-26 LAB — CBC WITH DIFFERENTIAL/PLATELET
Abs Immature Granulocytes: 0.01 10*3/uL (ref 0.00–0.07)
Basophils Absolute: 0 10*3/uL (ref 0.0–0.1)
Basophils Relative: 0 %
Eosinophils Absolute: 0 10*3/uL (ref 0.0–0.5)
Eosinophils Relative: 0 %
HCT: 41.8 % (ref 36.0–46.0)
Hemoglobin: 14.9 g/dL (ref 12.0–15.0)
Immature Granulocytes: 0 %
Lymphocytes Relative: 26 %
Lymphs Abs: 0.8 10*3/uL (ref 0.7–4.0)
MCH: 31.7 pg (ref 26.0–34.0)
MCHC: 35.6 g/dL (ref 30.0–36.0)
MCV: 88.9 fL (ref 80.0–100.0)
Monocytes Absolute: 0.5 10*3/uL (ref 0.1–1.0)
Monocytes Relative: 17 %
Neutro Abs: 1.7 10*3/uL (ref 1.7–7.7)
Neutrophils Relative %: 57 %
Platelets: 173 10*3/uL (ref 150–400)
RBC: 4.7 MIL/uL (ref 3.87–5.11)
RDW: 12.1 % (ref 11.5–15.5)
WBC: 3.1 10*3/uL — ABNORMAL LOW (ref 4.0–10.5)
nRBC: 0 % (ref 0.0–0.2)

## 2022-04-26 LAB — CMP (CANCER CENTER ONLY)
ALT: 16 U/L (ref 0–44)
AST: 16 U/L (ref 15–41)
Albumin: 4.4 g/dL (ref 3.5–5.0)
Alkaline Phosphatase: 53 U/L (ref 38–126)
Anion gap: 5 (ref 5–15)
BUN: 11 mg/dL (ref 6–20)
CO2: 26 mmol/L (ref 22–32)
Calcium: 9.7 mg/dL (ref 8.9–10.3)
Chloride: 105 mmol/L (ref 98–111)
Creatinine: 0.69 mg/dL (ref 0.44–1.00)
GFR, Estimated: >60 mL/min (ref >60)
Glucose, Bld: 94 mg/dL (ref 70–99)
Potassium: 3.7 mmol/L (ref 3.5–5.1)
Sodium: 136 mmol/L (ref 135–145)
Total Bilirubin: 0.4 mg/dL (ref 0.3–1.2)
Total Protein: 7.7 g/dL (ref 6.5–8.1)

## 2022-04-26 LAB — HEPATITIS PANEL, ACUTE
HCV Ab: NONREACTIVE
Hep A IgM: NONREACTIVE
Hep B C IgM: NONREACTIVE
Hepatitis B Surface Ag: NONREACTIVE

## 2022-04-26 LAB — TECHNOLOGIST SMEAR REVIEW: Plt Morphology: NORMAL

## 2022-04-26 LAB — IRON AND IRON BINDING CAPACITY (CC-WL,HP ONLY)
Iron: 141 ug/dL (ref 28–170)
Saturation Ratios: 44 % — ABNORMAL HIGH (ref 10.4–31.8)
TIBC: 322 ug/dL (ref 250–450)
UIBC: 181 ug/dL (ref 148–442)

## 2022-04-26 LAB — VITAMIN B12: Vitamin B-12: 269 pg/mL (ref 180–914)

## 2022-04-26 LAB — FERRITIN: Ferritin: 81 ng/mL (ref 11–307)

## 2022-04-26 LAB — HIV ANTIBODY (ROUTINE TESTING W REFLEX): HIV Screen 4th Generation wRfx: NONREACTIVE

## 2022-04-26 NOTE — Progress Notes (Signed)
[facility] NOTE  Patient Care Team: Nicolette Bang, MD (Inactive) as PCP - General (Family Medicine)  CHIEF COMPLAINTS/PURPOSE OF CONSULTATION:  Worsening Leukopenia.  ASSESSMENT & PLAN  This is a pleasant 27 year old female patient with no known major medical comorbidities referred to hematology for worsening leukopenia, neutropenia, lymphocytopenia. She reports some skin rash on her face, feeling very fatigued and is very worried that she has discoid lupus. We have discussed the following differential diagnosis for leukopenia.  Differential diagnosis: 1. Medications 2. viral illnesses 3. Autoimmune conditions like lupus 4. D-22 or folic acid deficiencies 5. Bone marrow disorders 6. Cyclical neutropenia 7. Ethnicity related    Recommendation: 1. Recheck CBC with differential 2. G-25 and folic acid levels 3. ANA 4. Flow cytometry 5.Iron panel 6. TSH 7. Hepatitis panel, HIV  She will return to clinic in about 2 to 4 weeks to review labs from today.  If she does have labs suggesting a possible autoimmune etiology which is high on the differential, she will then need a rheumatology consultation.  If there is no evidence of explanation for the worsening leukopenia as on the above-mentioned labs, then we may consider doing a bone marrow aspiration and biopsy.   Orders Placed This Encounter  Procedures   CBC with Differential/Platelet    Standing Status:   Standing    Number of Occurrences:   22    Standing Expiration Date:   04/27/2023   Iron and Iron Binding Capacity ([facility] only)    Standing Status:   Future    Standing Expiration Date:   04/27/2023   Ferritin    Standing Status:   Future    Standing Expiration Date:   04/26/2023   Vitamin B12    Standing Status:   Future    Standing Expiration Date:   04/26/2023   ANA, IFA (with reflex)    Standing Status:   Future    Standing Expiration Date:   04/26/2023   Hepatitis panel, acute     Standing Status:   Future    Standing Expiration Date:   04/26/2023   TSH    Standing Status:   Standing    Number of Occurrences:   22    Standing Expiration Date:   04/27/2023   CMP ([facility] only)    Standing Status:   Future    Standing Expiration Date:   04/27/2023   Flow Cytometry, Peripheral Blood (Oncology)    Standing Status:   Future    Standing Expiration Date:   04/27/2023   Technologist smear review    Order Specific Question:   Clinical information:    Answer:   Leukopenia, neutrpenia,     HISTORY OF PRESENTING ILLNESS:  Jammi R Garrido 27 y.o. female is here because of worsening leukopenia.  This is a very pleasant 27 year old female patient with no significant past medical history referred to hematology for worsening leukopenia including neutropenia, lymphocytopenia.  Patient reports that about 2 years ago she has started noticing some skin rash on her scalp, on her ears, on her face especially her nose which flares up from time to time.  She has seen dermatology who suspected the diagnosis to be discoid lupus but biopsy did not show evidence of lupus.  She also tells me that she feels unwell overall, fatigue [place], she is more irritable because of her fatigue.  She has 1 child who is 51 years old.  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  Her mom also has lupus however she states is that it is a drug-induced lupus.  She denies any arthritis.  No recurrent infections or hospitalizations.  She had lost some weight in the past couple years however she wonders if this is stress related.  Rest of the pertinent 10 point ROS reviewed and negative  MEDICAL HISTORY:  Past Medical History:  Diagnosis Date   Anxiety 05/17/2017   Genital herpes    Headache    Left cornual ectopic pregnancy 05/16/2017   8 week viable cornual ruptured ectopic pregnancy with ~500 ml of hemoperitoneum -> wedge resection Needs cesarean deliveries at 37 weeks for any subsequent pregnancies; labor  is absolutely contraindicated.   s/p ELAP, left cornual wedge resection & salpingectomy for ruptured cornual ectopic pregnancy on 05/16/17 05/16/2017   Seasonal allergies     SURGICAL HISTORY: Past Surgical History:  Procedure Laterality Date   LAPAROSCOPY N/A 05/16/2017   Procedure: LAPAROSCOPY DIAGNOSTIC;  Surgeon: Osborne Oman, MD;  Location: [facility];  Service: Gynecology;  Laterality: N/A;   LAPAROTOMY N/A 05/16/2017   Procedure: EXPLORATORY LAPAROTOMY, LEFT CORNUAL WEDGE RESECTION ECTOPIC PREGNANCY;  Surgeon: Osborne Oman, MD;  Location: [facility];  Service: Gynecology;  Laterality: N/A;   UNILATERAL SALPINGECTOMY Left 05/16/2017   Procedure: UNILATERAL SALPINGECTOMY;  Surgeon: Osborne Oman, MD;  Location: [facility];  Service: Gynecology;  Laterality: Left;   WISDOM TOOTH EXTRACTION      SOCIAL HISTORY: Social History   Socioeconomic History   Marital status: Significant Other    Spouse name: Not on file   Number of children: Not on file   Years of education: Not on file   Highest education level: Not on file  Occupational History   Not on file  Tobacco Use   Smoking status: Former    Packs/day: 0.25    Years: 5.00    Total pack years: 1.25    Types: Cigarettes   Smokeless tobacco: Never  Vaping Use   Vaping Use: Never used  Substance and Sexual Activity   Alcohol use: Yes    Comment: weekends   Drug use: Not Currently    Types: Marijuana    Comment: last useDec 1 2015   Sexual activity: Yes    Birth control/protection: None  Other Topics Concern   Not on file  Social History Narrative   Not on file   Social Determinants of Health   Financial Resource Strain: Not on file  Food Insecurity: Not on file  Transportation Needs: Not on file  Physical Activity: Not on file  Stress: Not on file  Social Connections: Not on file  Intimate Partner Violence: Not on file    FAMILY HISTORY: Family History  Problem Relation Age of Onset   Lupus Mother     Other Mother        prediabetes   Diabetes Father        B leg amputation   Kidney disease Father    Diabetes Paternal Grandmother    Hypertension Paternal Grandmother    Diabetes Paternal Grandfather    Hypertension Paternal Grandfather    Diabetes Cousin    Hyperlipidemia Cousin     ALLERGIES:  has No Known Allergies.  MEDICATIONS:  Current Outpatient Medications  Medication Sig Dispense Refill   naproxen (NAPROSYN) 500 MG tablet Take 1 tablet (500 mg total) by mouth 2 (two) times daily. 30 tablet 0   sertraline (ZOLOFT) 25 MG tablet Take one tablet daily. If tolerating, increase  to two tablets in one week. 180 tablet 0   No current facility-administered medications for this visit.     PHYSICAL EXAMINATION: ECOG PERFORMANCE STATUS: 0 - Asymptomatic  Vitals:   04/26/22 1034  BP: 131/74  Pulse: 97  Resp: 20  Temp: (!) 97.5 F (36.4 C)   Filed Weights   04/26/22 1034  Weight: 167 lb 9.6 oz (76 kg)    GENERAL:alert, no distress and comfortable SKIN: skin color, texture, turgor are normal, no rashes or significant lesions EYES: normal, conjunctiva are pink and non-injected, sclera clear OROPHARYNX:no exudate, no erythema and lips, buccal mucosa, and tongue normal  NECK: supple, thyroid normal size, non-tender, without nodularity LYMPH:  no palpable lymphadenopathy in the cervical, axillary  LUNGS: clear to auscultation and percussion with normal breathing effort HEART: regular rate & rhythm and no murmurs and no lower extremity edema ABDOMEN:abdomen soft, non-tender and normal bowel sounds Musculoskeletal:no cyanosis of digits and no clubbing  PSYCH: alert & oriented x 3 with fluent speech NEURO: no focal motor/sensory deficits  LABORATORY DATA:  I have reviewed the data as listed Lab Results  Component Value Date   WBC 2.6 (L) 03/21/2021   HGB 13.3 03/21/2021   HCT 39.4 03/21/2021   MCV 86.4 03/21/2021   PLT 160 03/21/2021     Chemistry      Component  Value Date/Time   NA 135 03/21/2021 1027   NA 139 04/19/2019 1524   K 3.4 (L) 03/21/2021 1027   CL 99 03/21/2021 1027   CO2 28 03/21/2021 1027   BUN 8 03/21/2021 1027   BUN 13 04/19/2019 1524   CREATININE 0.70 03/21/2021 1027      Component Value Date/Time   CALCIUM 9.1 03/21/2021 1027   ALKPHOS 45 03/21/2021 1027   AST 42 (H) 03/21/2021 1027   ALT 24 03/21/2021 1027   BILITOT 0.6 03/21/2021 1027       RADIOGRAPHIC STUDIES: I have personally reviewed the radiological images as listed and agreed with the findings in the report. No results found.  All questions were answered. The patient knows to call the clinic with any problems, questions or concerns. I spent 45 minutes in the care of this patient including H and P, review of records, counseling and coordination of care.     Benay Pike, MD 04/26/2022 10:45 AM

## 2022-04-28 ENCOUNTER — Telehealth: Payer: Self-pay | Admitting: Hematology and Oncology

## 2022-04-28 LAB — TSH: TSH: 0.74 u[IU]/mL (ref 0.350–4.500)

## 2022-04-28 NOTE — Telephone Encounter (Signed)
Left patient a vm regarding appointment update

## 2022-04-29 LAB — ANTINUCLEAR ANTIBODIES, IFA: ANA Ab, IFA: NEGATIVE

## 2022-05-01 LAB — SURGICAL PATHOLOGY

## 2022-05-05 ENCOUNTER — Other Ambulatory Visit: Payer: Self-pay | Admitting: Hematology and Oncology

## 2022-05-05 DIAGNOSIS — D72819 Decreased white blood cell count, unspecified: Secondary | ICD-10-CM

## 2022-05-05 LAB — FLOW CYTOMETRY

## 2022-05-05 NOTE — Progress Notes (Signed)
Pathology request repeat flow at [facility] re ordered, and will have to be redrawn.

## 2022-05-07 ENCOUNTER — Telehealth: Payer: Self-pay

## 2022-05-07 NOTE — Telephone Encounter (Signed)
-----   Message from Rolland Bimler, RN sent at 05/05/2022  5:59 PM EST ----- Regarding: Forwarding message from MD  ----- Message ----- From: Benay Pike, MD Sent: 05/05/2022   2:15 PM EST To: Rolland Bimler, RN  Just FYI, pathology suggesting a send out flow. Spoke to Dr Arby Barrette, he will talk to the flow lab and make sure this will be sent out for [facility] evaluation

## 2022-05-07 NOTE — Telephone Encounter (Signed)
Called pt to set up lab appt. She is coming in 05/08/22 at 1200

## 2022-05-08 ENCOUNTER — Other Ambulatory Visit: Payer: Self-pay

## 2022-05-08 ENCOUNTER — Inpatient Hospital Stay: Payer: Medicaid Other

## 2022-05-08 DIAGNOSIS — D72819 Decreased white blood cell count, unspecified: Secondary | ICD-10-CM | POA: Diagnosis not present

## 2022-05-08 LAB — CBC WITH DIFFERENTIAL/PLATELET
Abs Immature Granulocytes: 0.01 10*3/uL (ref 0.00–0.07)
Basophils Absolute: 0 10*3/uL (ref 0.0–0.1)
Basophils Relative: 0 %
Eosinophils Absolute: 0 10*3/uL (ref 0.0–0.5)
Eosinophils Relative: 1 %
HCT: 40.2 % (ref 36.0–46.0)
Hemoglobin: 14.1 g/dL (ref 12.0–15.0)
Immature Granulocytes: 0 %
Lymphocytes Relative: 27 %
Lymphs Abs: 0.8 10*3/uL (ref 0.7–4.0)
MCH: 31.4 pg (ref 26.0–34.0)
MCHC: 35.1 g/dL (ref 30.0–36.0)
MCV: 89.5 fL (ref 80.0–100.0)
Monocytes Absolute: 0.5 10*3/uL (ref 0.1–1.0)
Monocytes Relative: 15 %
Neutro Abs: 1.8 10*3/uL (ref 1.7–7.7)
Neutrophils Relative %: 57 %
Platelets: 180 10*3/uL (ref 150–400)
RBC: 4.49 MIL/uL (ref 3.87–5.11)
RDW: 12.3 % (ref 11.5–15.5)
WBC: 3.1 10*3/uL — ABNORMAL LOW (ref 4.0–10.5)
nRBC: 0 % (ref 0.0–0.2)

## 2022-05-08 LAB — TSH: TSH: 0.978 u[IU]/mL (ref 0.350–4.500)

## 2022-05-09 LAB — FLOW CYTOMETRY

## 2022-05-12 ENCOUNTER — Ambulatory Visit: Payer: No Typology Code available for payment source | Admitting: Hematology and Oncology

## 2022-05-12 ENCOUNTER — Encounter (HOSPITAL_COMMUNITY): Payer: Self-pay | Admitting: Hematology and Oncology

## 2022-05-13 ENCOUNTER — Inpatient Hospital Stay (HOSPITAL_BASED_OUTPATIENT_CLINIC_OR_DEPARTMENT_OTHER): Payer: Medicaid Other | Admitting: Hematology and Oncology

## 2022-05-13 ENCOUNTER — Other Ambulatory Visit: Payer: Self-pay

## 2022-05-13 VITALS — BP 130/72 | HR 88 | Temp 97.7°F | Resp 16 | Ht 68.0 in | Wt 167.6 lb

## 2022-05-13 DIAGNOSIS — D72819 Decreased white blood cell count, unspecified: Secondary | ICD-10-CM

## 2022-05-13 NOTE — Progress Notes (Unsigned)
[facility] NOTE  Patient Care Team: Nicolette Bang, MD (Inactive) as PCP - General (Family Medicine)  CHIEF COMPLAINTS/PURPOSE OF CONSULTATION:   Worsening Leukopenia.  ASSESSMENT & PLAN  This is a pleasant 27 year old female patient with no known major medical comorbidities referred to hematology for worsening leukopenia, neutropenia, lymphocytopenia. She reports some skin rash on her face, feeling very fatigued and is very worried that she has discoid lupus. We have discussed the following differential diagnosis for leukopenia.  Differential diagnosis: 1. Medications 2. viral illnesses 3. Autoimmune conditions like lupus 4. 0000000 or folic acid deficiencies 5. Bone marrow disorders 6. Cyclical neutropenia 7. Ethnicity related    Recommendation: 1. Recheck CBC with differential 2. 0000000 and folic acid levels 3. ANA 4. Flow cytometry 5.Iron panel 6. TSH 7. Hepatitis panel, HIV  She will return to clinic in about 2 to 4 weeks to review labs from today.  If she does have labs suggesting a possible autoimmune etiology which is high on the differential, she will then need a rheumatology consultation.  If there is no evidence of explanation for the worsening leukopenia as on the above-mentioned labs, then we may consider doing a bone marrow aspiration and biopsy.   No orders of the defined types were placed in this encounter.    HISTORY OF PRESENTING ILLNESS:  Sara Rubio 27 y.o. female is here because of worsening leukopenia.  This is a very pleasant 27 year old female patient with no significant past medical history referred to hematology for worsening leukopenia including neutropenia, lymphocytopenia.  Patient reports that about 2 years ago she has started noticing some skin rash on her scalp, on her ears, on her face especially her nose which flares up from time to time.  She has seen dermatology who suspected the diagnosis to be discoid  lupus but biopsy did not show evidence of lupus.  She also tells me that she feels unwell overall, fatigue [place], she is more irritable because of her fatigue.  She has 1 child who is 23 years old.  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  Her mom also has lupus however she states is that it is a drug-induced lupus.  She denies any arthritis.  No recurrent infections or hospitalizations.  She had lost some weight in the past couple years however she wonders if this is stress related.  Rest of the pertinent 10 point ROS reviewed and negative  MEDICAL HISTORY:  Past Medical History:  Diagnosis Date   Anxiety 05/17/2017   Genital herpes    Headache    Left cornual ectopic pregnancy 05/16/2017   8 week viable cornual ruptured ectopic pregnancy with ~500 ml of hemoperitoneum -> wedge resection Needs cesarean deliveries at 37 weeks for any subsequent pregnancies; labor is absolutely contraindicated.   s/p ELAP, left cornual wedge resection & salpingectomy for ruptured cornual ectopic pregnancy on 05/16/17 05/16/2017   Seasonal allergies     SURGICAL HISTORY: Past Surgical History:  Procedure Laterality Date   LAPAROSCOPY N/A 05/16/2017   Procedure: LAPAROSCOPY DIAGNOSTIC;  Surgeon: Osborne Oman, MD;  Location: [facility];  Service: Gynecology;  Laterality: N/A;   LAPAROTOMY N/A 05/16/2017   Procedure: EXPLORATORY LAPAROTOMY, LEFT CORNUAL WEDGE RESECTION ECTOPIC PREGNANCY;  Surgeon: Osborne Oman, MD;  Location: [facility];  Service: Gynecology;  Laterality: N/A;   UNILATERAL SALPINGECTOMY Left 05/16/2017   Procedure: UNILATERAL SALPINGECTOMY;  Surgeon: Osborne Oman, MD;  Location: [facility];  Service: Gynecology;  Laterality:  Left;   WISDOM TOOTH EXTRACTION      SOCIAL HISTORY: Social History   Socioeconomic History   Marital status: Significant Other    Spouse name: Not on file   Number of children: Not on file   Years of education: Not on file   Highest education level: Not on  file  Occupational History   Not on file  Tobacco Use   Smoking status: Former    Packs/day: 0.25    Years: 5.00    Total pack years: 1.25    Types: Cigarettes   Smokeless tobacco: Never  Vaping Use   Vaping Use: Never used  Substance and Sexual Activity   Alcohol use: Yes    Comment: weekends   Drug use: Not Currently    Types: Marijuana    Comment: last useDec 1 2015   Sexual activity: Yes    Birth control/protection: None  Other Topics Concern   Not on file  Social History Narrative   Not on file   Social Determinants of Health   Financial Resource Strain: Not on file  Food Insecurity: Not on file  Transportation Needs: Not on file  Physical Activity: Not on file  Stress: Not on file  Social Connections: Not on file  Intimate Partner Violence: Not on file    FAMILY HISTORY: Family History  Problem Relation Age of Onset   Lupus Mother    Other Mother        prediabetes   Diabetes Father        B leg amputation   Kidney disease Father    Diabetes Paternal Grandmother    Hypertension Paternal Grandmother    Diabetes Paternal Grandfather    Hypertension Paternal Grandfather    Diabetes Cousin    Hyperlipidemia Cousin     ALLERGIES:  has No Known Allergies.  MEDICATIONS:  Current Outpatient Medications  Medication Sig Dispense Refill   naproxen (NAPROSYN) 500 MG tablet Take 1 tablet (500 mg total) by mouth 2 (two) times daily. 30 tablet 0   sertraline (ZOLOFT) 25 MG tablet Take one tablet daily. If tolerating, increase to two tablets in one week. 180 tablet 0   No current facility-administered medications for this visit.     PHYSICAL EXAMINATION: ECOG PERFORMANCE STATUS: 0 - Asymptomatic  There were no vitals filed for this visit.  There were no vitals filed for this visit.   GENERAL:alert, no distress and comfortable SKIN: skin color, texture, turgor are normal, no rashes or significant lesions EYES: normal, conjunctiva are pink and  non-injected, sclera clear OROPHARYNX:no exudate, no erythema and lips, buccal mucosa, and tongue normal  NECK: supple, thyroid normal size, non-tender, without nodularity LYMPH:  no palpable lymphadenopathy in the cervical, axillary  LUNGS: clear to auscultation and percussion with normal breathing effort HEART: regular rate & rhythm and no murmurs and no lower extremity edema ABDOMEN:abdomen soft, non-tender and normal bowel sounds Musculoskeletal:no cyanosis of digits and no clubbing  PSYCH: alert & oriented x 3 with fluent speech NEURO: no focal motor/sensory deficits  LABORATORY DATA:  I have reviewed the data as listed Lab Results  Component Value Date   WBC 3.1 (L) 05/08/2022   HGB 14.1 05/08/2022   HCT 40.2 05/08/2022   MCV 89.5 05/08/2022   PLT 180 05/08/2022     Chemistry      Component Value Date/Time   NA 136 04/26/2022 1051   NA 139 04/19/2019 1524   K 3.7 04/26/2022 1051   CL 105  04/26/2022 1051   CO2 26 04/26/2022 1051   BUN 11 04/26/2022 1051   BUN 13 04/19/2019 1524   CREATININE 0.69 04/26/2022 1051      Component Value Date/Time   CALCIUM 9.7 04/26/2022 1051   ALKPHOS 53 04/26/2022 1051   AST 16 04/26/2022 1051   ALT 16 04/26/2022 1051   BILITOT 0.4 04/26/2022 1051       RADIOGRAPHIC STUDIES: I have personally reviewed the radiological images as listed and agreed with the findings in the report. No results found.  All questions were answered. The patient knows to call the clinic with any problems, questions or concerns. I spent 45 minutes in the care of this patient including H and P, review of records, counseling and coordination of care.     Benay Pike, MD 05/13/2022 3:59 PM

## 2022-05-19 ENCOUNTER — Telehealth: Payer: Self-pay | Admitting: Hematology and Oncology

## 2022-05-19 NOTE — Telephone Encounter (Signed)
Rescheduled patient per Dr Chryl Heck being out of office, patient aware of date and time of appointment.

## 2022-08-06 ENCOUNTER — Other Ambulatory Visit: Payer: No Typology Code available for payment source

## 2022-08-06 ENCOUNTER — Ambulatory Visit: Payer: No Typology Code available for payment source | Admitting: Hematology and Oncology

## 2022-08-13 ENCOUNTER — Inpatient Hospital Stay: Payer: Medicaid Other | Attending: Hematology and Oncology

## 2022-08-13 ENCOUNTER — Inpatient Hospital Stay: Payer: Medicaid Other | Admitting: Hematology and Oncology

## 2022-08-13 NOTE — Progress Notes (Deleted)
[facility] CONSULT NOTE  Patient Care Team: Arvilla Market, MD (Inactive) as PCP - General (Family Medicine)  CHIEF COMPLAINTS/PURPOSE OF CONSULTATION:   Leukopenia  ASSESSMENT & PLAN  This is a pleasant 27 year old female patient with no known major medical comorbidities referred to hematology for worsening leukopenia, neutropenia, lymphocytopenia.  She is here for follow-up.   Flow from [facility] with no immunophenotypic abnormalities.  She once again continues to have mild leukopenia and neutropenia.  We have discussed about other possible differential diagnosis including cyclical neutropenia versus benign ethnic neutropenia or autoimmune associated secondary leukopenia and neutropenia.      No orders of the defined types were placed in this encounter.    HISTORY OF PRESENTING ILLNESS:  Sara Rubio 27 y.o. female is here because of worsening leukopenia.  This is a very pleasant 27 year old female patient with no significant past medical history referred to hematology for worsening leukopenia including neutropenia, lymphocytopenia.   Rest of the pertinent 10 point ROS reviewed and negative  MEDICAL HISTORY:  Past Medical History:  Diagnosis Date   Anxiety 05/17/2017   Genital herpes    Headache    Left cornual ectopic pregnancy 05/16/2017   8 week viable cornual ruptured ectopic pregnancy with ~500 ml of hemoperitoneum -> wedge resection Needs cesarean deliveries at 37 weeks for any subsequent pregnancies; labor is absolutely contraindicated.   s/p ELAP, left cornual wedge resection & salpingectomy for ruptured cornual ectopic pregnancy on 05/16/17 05/16/2017   Seasonal allergies     SURGICAL HISTORY: Past Surgical History:  Procedure Laterality Date   LAPAROSCOPY N/A 05/16/2017   Procedure: LAPAROSCOPY DIAGNOSTIC;  Surgeon: Tereso Newcomer, MD;  Location: [facility];  Service: Gynecology;  Laterality: N/A;   LAPAROTOMY N/A 05/16/2017    Procedure: EXPLORATORY LAPAROTOMY, LEFT CORNUAL WEDGE RESECTION ECTOPIC PREGNANCY;  Surgeon: Tereso Newcomer, MD;  Location: [facility];  Service: Gynecology;  Laterality: N/A;   UNILATERAL SALPINGECTOMY Left 05/16/2017   Procedure: UNILATERAL SALPINGECTOMY;  Surgeon: Tereso Newcomer, MD;  Location: [facility];  Service: Gynecology;  Laterality: Left;   WISDOM TOOTH EXTRACTION      SOCIAL HISTORY: Social History   Socioeconomic History   Marital status: Significant Other    Spouse name: Not on file   Number of children: Not on file   Years of education: Not on file   Highest education level: Not on file  Occupational History   Not on file  Tobacco Use   Smoking status: Former    Packs/day: 0.25    Years: 5.00    Additional pack years: 0.00    Total pack years: 1.25    Types: Cigarettes   Smokeless tobacco: Never  Vaping Use   Vaping Use: Never used  Substance and Sexual Activity   Alcohol use: Yes    Comment: weekends   Drug use: Not Currently    Types: Marijuana    Comment: last useDec 1 2015   Sexual activity: Yes    Birth control/protection: None  Other Topics Concern   Not on file  Social History Narrative   Not on file   Social Determinants of Health   Financial Resource Strain: Not on file  Food Insecurity: Not on file  Transportation Needs: Not on file  Physical Activity: Not on file  Stress: Not on file  Social Connections: Not on file  Intimate Partner Violence: Not on file    FAMILY HISTORY: Family History  Problem Relation Age of Onset  Lupus Mother    Other Mother        prediabetes   Diabetes Father        B leg amputation   Kidney disease Father    Diabetes Paternal Grandmother    Hypertension Paternal Grandmother    Diabetes Paternal Grandfather    Hypertension Paternal Grandfather    Diabetes Cousin    Hyperlipidemia Cousin     ALLERGIES:  has No Known Allergies.  MEDICATIONS:  Current Outpatient Medications  Medication Sig Dispense  Refill   naproxen (NAPROSYN) 500 MG tablet Take 1 tablet (500 mg total) by mouth 2 (two) times daily. 30 tablet 0   sertraline (ZOLOFT) 25 MG tablet Take one tablet daily. If tolerating, increase to two tablets in one week. 180 tablet 0   No current facility-administered medications for this visit.     PHYSICAL EXAMINATION: ECOG PERFORMANCE STATUS: 0 - Asymptomatic  There were no vitals filed for this visit.   There were no vitals filed for this visit.  Physical examination deferred in lieu of counseling  LABORATORY DATA:  I have reviewed the data as listed Lab Results  Component Value Date   WBC 3.1 (L) 05/08/2022   HGB 14.1 05/08/2022   HCT 40.2 05/08/2022   MCV 89.5 05/08/2022   PLT 180 05/08/2022     Chemistry      Component Value Date/Time   NA 136 04/26/2022 1051   NA 139 04/19/2019 1524   K 3.7 04/26/2022 1051   CL 105 04/26/2022 1051   CO2 26 04/26/2022 1051   BUN 11 04/26/2022 1051   BUN 13 04/19/2019 1524   CREATININE 0.69 04/26/2022 1051      Component Value Date/Time   CALCIUM 9.7 04/26/2022 1051   ALKPHOS 53 04/26/2022 1051   AST 16 04/26/2022 1051   ALT 16 04/26/2022 1051   BILITOT 0.4 04/26/2022 1051       RADIOGRAPHIC STUDIES: I have personally reviewed the radiological images as listed and agreed with the findings in the report. No results found.  All questions were answered. The patient knows to call the clinic with any problems, questions or concerns. I spent 15 minutes in the care of this patient including H and P, review of records, counseling and coordination of care.     Rachel Moulds, MD 08/13/2022 8:10 AM

## 2022-12-18 NOTE — Progress Notes (Deleted)
Office Visit Note  Patient: Sara Rubio             Date of Birth: [birth year]           MRN: [id number]             PCP: Arvilla Market, MD (Inactive) Referring: Norm Salt, [place] Visit Date: 01/01/2023 Occupation: @GUAROCC @  Subjective:  No chief complaint on file.   History of Present Illness: Sara Rubio is a 27 y.o. female ***     Activities of Daily Living:  Patient reports morning stiffness for *** {minute/hour:19697}.   Patient {ACTIONS;DENIES/REPORTS:21021675::"Denies"} nocturnal pain.  Difficulty dressing/grooming: {ACTIONS;DENIES/REPORTS:21021675::"Denies"} Difficulty climbing stairs: {ACTIONS;DENIES/REPORTS:21021675::"Denies"} Difficulty getting out of chair: {ACTIONS;DENIES/REPORTS:21021675::"Denies"} Difficulty using hands for taps, buttons, cutlery, and/or writing: {ACTIONS;DENIES/REPORTS:21021675::"Denies"}  No Rheumatology ROS completed.   PMFS History:  Patient Active Problem List   Diagnosis Date Noted   Generalized anxiety disorder 03/08/2019   Depression 03/08/2019   Acute pain of right knee 01/06/2019   Anxiety 05/17/2017   s/p ELAP, left cornual wedge resection & salpingectomy for ruptured cornual ectopic pregnancy on 05/16/17 05/16/2017   Genital herpes     Past Medical History:  Diagnosis Date   Anxiety 05/17/2017   Genital herpes    Headache    Left cornual ectopic pregnancy 05/16/2017   8 week viable cornual ruptured ectopic pregnancy with ~500 ml of hemoperitoneum -> wedge resection Needs cesarean deliveries at 37 weeks for any subsequent pregnancies; labor is absolutely contraindicated.   s/p ELAP, left cornual wedge resection & salpingectomy for ruptured cornual ectopic pregnancy on 05/16/17 05/16/2017   Seasonal allergies     Family History  Problem Relation Age of Onset   Lupus Mother    Other Mother        prediabetes   Diabetes Father        B leg amputation   Kidney disease Father    Diabetes Paternal  Grandmother    Hypertension Paternal Grandmother    Diabetes Paternal Grandfather    Hypertension Paternal Grandfather    Diabetes Cousin    Hyperlipidemia Cousin    Past Surgical History:  Procedure Laterality Date   LAPAROSCOPY N/A 05/16/2017   Procedure: LAPAROSCOPY DIAGNOSTIC;  Surgeon: Tereso Newcomer, MD;  Location: [facility];  Service: Gynecology;  Laterality: N/A;   LAPAROTOMY N/A 05/16/2017   Procedure: EXPLORATORY LAPAROTOMY, LEFT CORNUAL WEDGE RESECTION ECTOPIC PREGNANCY;  Surgeon: Tereso Newcomer, MD;  Location: [facility];  Service: Gynecology;  Laterality: N/A;   UNILATERAL SALPINGECTOMY Left 05/16/2017   Procedure: UNILATERAL SALPINGECTOMY;  Surgeon: Tereso Newcomer, MD;  Location: [facility];  Service: Gynecology;  Laterality: Left;   WISDOM TOOTH EXTRACTION     Social History   Social History Narrative   Not on file   There is no immunization history for the selected administration types on file for this patient.   Objective: Vital Signs: There were no vitals taken for this visit.   Physical Exam   Musculoskeletal Exam: ***  CDAI Exam: CDAI Score: -- Patient Global: --; Provider Global: -- Swollen: --; Tender: -- Joint Exam 01/01/2023   No joint exam has been documented for this visit   There is currently no information documented on the homunculus. Go to the Rheumatology activity and complete the homunculus joint exam.  Investigation: No additional findings.  Imaging: No results found.  Recent Labs: Lab Results  Component Value Date   WBC 3.1 (L) 05/08/2022   HGB 14.1 05/08/2022  PLT 180 05/08/2022   NA 136 04/26/2022   K 3.7 04/26/2022   CL 105 04/26/2022   CO2 26 04/26/2022   GLUCOSE 94 04/26/2022   BUN 11 04/26/2022   CREATININE 0.69 04/26/2022   BILITOT 0.4 04/26/2022   ALKPHOS 53 04/26/2022   AST 16 04/26/2022   ALT 16 04/26/2022   PROT 7.7 04/26/2022   ALBUMIN 4.4 04/26/2022   CALCIUM 9.7 04/26/2022   GFRAA 142 04/19/2019    July 09, 2022 ANA 1: 80 NS, 1: 40 cytoplasmic, dsDNA negative, CRP 1.0  Speciality Comments: No specialty comments available.  Procedures:  No procedures performed Allergies: Patient has no known allergies.   Assessment / Plan:     Visit Diagnoses: Facial rash  Positive ANA (antinuclear antibody)  Other neutropenia (HCC)  Acne vulgaris  Vitamin D deficiency  Anxiety and depression  Dysmenorrhea  Genital herpes simplex, unspecified site  Smoker  Orders: No orders of the defined types were placed in this encounter.  No orders of the defined types were placed in this encounter.   Face-to-face time spent with patient was *** minutes. Greater than 50% of time was spent in counseling and coordination of care.  Follow-Up Instructions: No follow-ups on file.   Pollyann Savoy, MD  Note - This record has been created using Animal nutritionist.  Chart creation errors have been sought, but may not always  have been located. Such creation errors do not reflect on  the standard of medical care.

## 2023-01-01 ENCOUNTER — Encounter: Payer: No Typology Code available for payment source | Admitting: Rheumatology

## 2023-01-01 DIAGNOSIS — N946 Dysmenorrhea, unspecified: Secondary | ICD-10-CM

## 2023-01-01 DIAGNOSIS — A6000 Herpesviral infection of urogenital system, unspecified: Secondary | ICD-10-CM

## 2023-01-01 DIAGNOSIS — R21 Rash and other nonspecific skin eruption: Secondary | ICD-10-CM

## 2023-01-01 DIAGNOSIS — E559 Vitamin D deficiency, unspecified: Secondary | ICD-10-CM

## 2023-01-01 DIAGNOSIS — L700 Acne vulgaris: Secondary | ICD-10-CM

## 2023-01-01 DIAGNOSIS — R768 Other specified abnormal immunological findings in serum: Secondary | ICD-10-CM

## 2023-01-01 DIAGNOSIS — F17200 Nicotine dependence, unspecified, uncomplicated: Secondary | ICD-10-CM

## 2023-01-01 DIAGNOSIS — D708 Other neutropenia: Secondary | ICD-10-CM

## 2023-01-01 DIAGNOSIS — F419 Anxiety disorder, unspecified: Secondary | ICD-10-CM

## 2023-02-04 ENCOUNTER — Ambulatory Visit: Payer: No Typology Code available for payment source | Admitting: Rheumatology

## 2023-04-19 ENCOUNTER — Inpatient Hospital Stay (HOSPITAL_COMMUNITY)
Admission: AD | Admit: 2023-04-19 | Discharge: 2023-04-19 | Disposition: A | Discharge status: Home / Self Care | Payer: Medicaid Other | Attending: Family Medicine | Admitting: Family Medicine

## 2023-04-19 ENCOUNTER — Other Ambulatory Visit: Payer: Self-pay

## 2023-04-19 ENCOUNTER — Encounter (HOSPITAL_COMMUNITY): Payer: Self-pay | Admitting: Obstetrics and Gynecology

## 2023-04-19 ENCOUNTER — Inpatient Hospital Stay (HOSPITAL_COMMUNITY): Payer: Medicaid Other

## 2023-04-19 DIAGNOSIS — O3680X0 Pregnancy with inconclusive fetal viability, not applicable or unspecified: Secondary | ICD-10-CM | POA: Diagnosis not present

## 2023-04-19 DIAGNOSIS — O09291 Supervision of pregnancy with other poor reproductive or obstetric history, first trimester: Secondary | ICD-10-CM | POA: Insufficient documentation

## 2023-04-19 DIAGNOSIS — O26891 Other specified pregnancy related conditions, first trimester: Secondary | ICD-10-CM | POA: Insufficient documentation

## 2023-04-19 DIAGNOSIS — O26899 Other specified pregnancy related conditions, unspecified trimester: Secondary | ICD-10-CM

## 2023-04-19 DIAGNOSIS — Z3A01 Less than 8 weeks gestation of pregnancy: Secondary | ICD-10-CM | POA: Diagnosis not present

## 2023-04-19 DIAGNOSIS — R109 Unspecified abdominal pain: Secondary | ICD-10-CM | POA: Insufficient documentation

## 2023-04-19 LAB — CBC
HCT: 38.3 % (ref 36.0–46.0)
Hemoglobin: 13.1 g/dL (ref 12.0–15.0)
MCH: 30.7 pg (ref 26.0–34.0)
MCHC: 34.2 g/dL (ref 30.0–36.0)
MCV: 89.7 fL (ref 80.0–100.0)
Platelets: 244 10*3/uL (ref 150–400)
RBC: 4.27 MIL/uL (ref 3.87–5.11)
RDW: 12.1 % (ref 11.5–15.5)
WBC: 4.4 10*3/uL (ref 4.0–10.5)
nRBC: 0 % (ref 0.0–0.2)

## 2023-04-19 LAB — WET PREP, GENITAL
Sperm: NONE SEEN
Trich, Wet Prep: NONE SEEN
WBC, Wet Prep HPF POC: <10 (ref <10)
Yeast Wet Prep HPF POC: NONE SEEN

## 2023-04-19 LAB — ABO/RH: ABO/RH(D): B POS

## 2023-04-19 LAB — POCT PREGNANCY, URINE: Preg Test, Ur: POSITIVE — AB

## 2023-04-19 LAB — HCG, QUANTITATIVE, PREGNANCY: hCG, Beta Chain, Quant, S: 7362 m[IU]/mL — ABNORMAL HIGH (ref <5)

## 2023-04-19 MED ORDER — PROMETHAZINE HCL 12.5 MG PO TABS: 12.5000 mg | ORAL_TABLET | Freq: Four times a day (QID) | ORAL | 2 refills | Status: DC | PRN

## 2023-04-19 NOTE — MAU Note (Signed)
Sara Rubio is a 28 y.o. at Unknown here in MAU reporting: she had a +HPT, has a Hx of ectopic pregnancy and wants to be make sure this pregnancy isn't growing in tubes.  Reports having mild abdominal cramping, denies VB.  LMP: 03/09/2023 Onset of complaint: 1-2 weeks Pain score: 3 Vitals:   04/19/23 1549  BP: (!) 128/55  Pulse: 94  Resp: 18  Temp: 98.8 F (37.1 C)  SpO2: 100%     FHT: NA  Lab orders placed from triage: UPT

## 2023-04-19 NOTE — Discharge Instructions (Addendum)
You were seen for abdominal pain and concern for your pregnancy. We confirmed a pregnancy in your uterus but unsure if there there is normally progressing pregnancy. You will need another Korea in about 10 days. I have sent a message to the [facility] at [facility] to get you scheduled for this ultrasound. If you would like to see another provider you can call them to get scheduled for an [place].     [facility] [employer] for [employer] at [employer] for [facility]             [address] [place], [place] [address] [phone]  Center for [facility] at [facility]                                                             [address], [place], [place], [address] [phone]  Center for [facility] at [facility] [address], [address], [place], [place], [address] [phone]  Center for [facility] at [facility] [address], [address], [place], [place], [address] [phone]  Center for [facility] at [facility]                                 [address] [place], [place], [place], [address] [phone]  Center for [facility]                                    [address] [place], [place], [address] [phone]  Center for [facility] at [facility] [address], [place], [place], [address]                              [facility] [address], [place], [place], [address] [phone]  [facility]         Phone: [phone]  [facility]      Phone: [phone]   [facility]      Phone: [phone]  [facility]         Phone: [phone]   [facility]-Maternity    Phone: [phone]  [facility]      Phone:  [phone]  [facility]     Phone: [phone]  [facility]        Phone: [phone]  [facility]) [phone]  [facility]      Phone: [phone]

## 2023-04-19 NOTE — MAU Provider Note (Signed)
History     CSN: [id number]  Arrival date and time: 04/19/23 1515   Event Date/Time   First Provider Initiated Contact with Patient 04/19/23 1814      Chief Complaint  Patient presents with  . Possible Pregnancy   Possible Pregnancy Pertinent negatives include no chest pain, chills, coughing, fever, headaches, myalgias, nausea, rash, vomiting or weakness.   Patient is 28 y.o. [id number] [redacted]w[redacted]d here with complaints of mild cramping and positive pregnancy test at home. Patient has a history of ectopic x2 including a cornual ectopic that required resection.  Reports she is here to help make sure the pregnancy is not in her tube again. Denies LOF, VB, contractions, vaginal discharge. Reports h/o NV in pregnancy   OB History     Gravida  6   Para  1   Term  1   Preterm      AB  3   Living  1      SAB      IAB  2   Ectopic  1   Multiple  0   Live Births  1           Past Medical History:  Diagnosis Date  . Anxiety 05/17/2017  . Genital herpes   . Headache   . Left cornual ectopic pregnancy 05/16/2017   8 week viable cornual ruptured ectopic pregnancy with ~500 ml of hemoperitoneum -> wedge resection Needs cesarean deliveries at 37 weeks for any subsequent pregnancies; labor is absolutely contraindicated.  . s/p ELAP, left cornual wedge resection & salpingectomy for ruptured cornual ectopic pregnancy on 05/16/17 05/16/2017  . Seasonal allergies     Past Surgical History:  Procedure Laterality Date  . LAPAROSCOPY N/A 05/16/2017   Procedure: LAPAROSCOPY DIAGNOSTIC;  Surgeon: Tereso Newcomer, MD;  Location: [facility];  Service: Gynecology;  Laterality: N/A;  . LAPAROTOMY N/A 05/16/2017   Procedure: EXPLORATORY LAPAROTOMY, LEFT CORNUAL WEDGE RESECTION ECTOPIC PREGNANCY;  Surgeon: Tereso Newcomer, MD;  Location: [facility];  Service: Gynecology;  Laterality: N/A;  . UNILATERAL SALPINGECTOMY Left 05/16/2017   Procedure: UNILATERAL SALPINGECTOMY;  Surgeon: Tereso Newcomer,  MD;  Location: [facility];  Service: Gynecology;  Laterality: Left;  . WISDOM TOOTH EXTRACTION      Family History  Problem Relation Age of Onset  . Lupus Mother   . Other Mother        prediabetes  . Diabetes Father        B leg amputation  . Kidney disease Father   . Diabetes Paternal Grandmother   . Hypertension Paternal Grandmother   . Diabetes Paternal Grandfather   . Hypertension Paternal Grandfather   . Diabetes Cousin   . Hyperlipidemia Cousin     Social History   Tobacco Use  . Smoking status: Former    Current packs/day: 0.25    Average packs/day: 0.3 packs/day for 5.0 years (1.3 ttl pk-yrs)    Types: Cigarettes  . Smokeless tobacco: Never  Vaping Use  . Vaping status: Never Used  Substance Use Topics  . Alcohol use: Yes    Comment: weekends  . Drug use: Not Currently    Types: Marijuana    Comment: last useDec 1 2015    Allergies: No Known Allergies  Medications Prior to Admission  Medication Sig Dispense Refill Last Dose/Taking  . naproxen (NAPROSYN) 500 MG tablet Take 1 tablet (500 mg total) by mouth 2 (two) times daily. 30 tablet 0   . sertraline (ZOLOFT) 25 MG  tablet Take one tablet daily. If tolerating, increase to two tablets in one week. 180 tablet 0     Review of Systems  Constitutional:  Negative for chills and fever.  Respiratory:  Negative for cough and shortness of breath.   Cardiovascular:  Negative for chest pain.  Gastrointestinal:  Negative for nausea and vomiting.  Genitourinary:  Negative for dysuria, flank pain and frequency.  Musculoskeletal:  Negative for myalgias.  Skin:  Negative for rash.  Neurological:  Negative for dizziness, weakness and headaches.  Hematological:  Does not bruise/bleed easily.  Psychiatric/Behavioral:  Negative for suicidal ideas. The patient is not nervous/anxious.    Physical Exam   Blood pressure (!) 128/55, pulse 94, temperature 98.8 F (37.1 C), temperature source Oral, resp. rate 18, height 5\' 8"   (1.727 m), weight 90.9 kg, last menstrual period 03/09/2023, SpO2 100%.  Physical Exam Vitals and nursing note reviewed.  Constitutional:      General: She is not in acute distress.    Appearance: She is well-developed.  HENT:     Head: Normocephalic and atraumatic.  Eyes:     General: No scleral icterus.    Conjunctiva/sclera: Conjunctivae normal.  Cardiovascular:     Rate and Rhythm: Normal rate.  Pulmonary:     Effort: Pulmonary effort is normal.  Chest:     Chest wall: No tenderness.  Abdominal:     Palpations: Abdomen is soft.     Tenderness: There is no abdominal tenderness. There is no guarding or rebound.  Genitourinary:    Vagina: Normal.  Musculoskeletal:        General: Normal range of motion.     Cervical back: Normal range of motion and neck supple.  Skin:    General: Skin is warm and dry.     Findings: No rash.  Neurological:     Mental Status: She is alert and oriented to person, place, and time.    MAU Course  Procedures  MDM: high  This patient presents to the ED for concern of   Chief Complaint  Patient presents with  . Possible Pregnancy     These complains involves an extensive number of treatment options, and is a complaint that carries with it a high risk of complications and morbidity.  The differential diagnosis for  1.abdominal pain in early pregnancy INCLUDES threatened miscarriage, ectopic pregnancy (unless IUP confirmed), pelvic infection, urinary tract infection or normal variant ligament related discomfort with live IUP.  Rare but possible DDX include appendicitis. Most likely for this patient is IUP normal variant because pain is mild and US showed a normally progressing pregnancy   Co morbidities that complicate the patient evaluation: Patient Active Problem List   Diagnosis Date Noted  . Generalized anxiety disorder 03/08/2019  . Depression 03/08/2019  . Acute pain of right knee 01/06/2019  . Anxiety 05/17/2017  . s/p ELAP, left  cornual wedge resection & salpingectomy for ruptured cornual ectopic pregnancy on 05/16/17 05/16/2017  . Genital herpes      External records from outside source obtained and reviewed including Prenatal care records   I ordered, and personally interpreted labs.  The pertinent results include:   Results for orders placed or performed during the hospital encounter of 04/19/23 (from the past 24 hours)  Pregnancy, urine POC     Status: Abnormal   Collection Time: 04/19/23  3:58 PM  Result Value Ref Range   Preg Test, Ur POSITIVE (A) NEGATIVE  ABO/Rh     Status:  None   Collection Time: 04/19/23  4:03 PM  Result Value Ref Range   ABO/RH(D) B POS    No rh immune globuloin      NOT A RH IMMUNE GLOBULIN CANDIDATE, PT RH POSITIVE Performed at [facility], [address] [place], [place] [address]   CBC     Status: None   Collection Time: 04/19/23  4:05 PM  Result Value Ref Range   WBC 4.4 4.0 - 10.5 K/uL   RBC 4.27 3.87 - 5.11 MIL/uL   Hemoglobin 13.1 12.0 - 15.0 g/dL   HCT 60.4 54.0 - 98.1 %   MCV 89.7 80.0 - 100.0 fL   MCH 30.7 26.0 - 34.0 pg   MCHC 34.2 30.0 - 36.0 g/dL   RDW 19.1 47.8 - 29.5 %   Platelets 244 150 - 400 K/uL   nRBC 0.0 0.0 - 0.2 %  hCG, quantitative, pregnancy     Status: Abnormal   Collection Time: 04/19/23  4:05 PM  Result Value Ref Range   hCG, Beta Chain, Quant, S 7,362 (H) <5 mIU/mL  Wet prep, genital     Status: Abnormal   Collection Time: 04/19/23  4:12 PM  Result Value Ref Range   Yeast Wet Prep HPF POC NONE SEEN NONE SEEN   Trich, Wet Prep NONE SEEN NONE SEEN   Clue Cells Wet Prep HPF POC PRESENT (A) NONE SEEN   WBC, Wet Prep HPF POC <10 <10   Sperm NONE SEEN      Imaging Studies ordered:  I ordered imaging studies includingTransvaginal [place] I independently visualized and interpreted imaging which showed  live IUP with FHR I agree with the radiologist interpretation US OB LESS THAN 14 WEEKS WITH OB TRANSVAGINAL Result Date:  04/19/2023 CLINICAL DATA:  Abdominal pain 1st trimester pregnancy. Prior ectopic pregnancy. EXAM: OBSTETRIC <14 WK [place] AND TRANSVAGINAL OB US TECHNIQUE: Both transabdominal and transvaginal ultrasound examinations were performed for complete evaluation of the gestation as well as the maternal uterus, adnexal regions, and pelvic cul-de-sac. Transvaginal technique was performed to assess early pregnancy. COMPARISON:  None Available. FINDINGS: Intrauterine gestational sac: Single Yolk sac:  Visualized. Embryo:  Not Visualized. MSD: 10 mm   5 w   5 d Subchorionic hemorrhage:  None visualized. Maternal uterus/adnexae: Normal appearance of right ovary. Hemorrhagic corpus luteum is seen within the left ovary measuring 4.0 x 2.7 cm. Small amount of simple free fluid also noted. IMPRESSION: Single intrauterine gestational sac measuring 5 weeks 5 days by mean sac diameter. Consider correlation with serial b-hCG levels, and followup ultrasound to assess viability in 10 days. 4 cm left ovarian hemorrhagic corpus luteum and small amount of free fluid. Electronically Signed   By: Danae Orleans M.D.   On: 04/19/2023 17:27     MAU Course: 6:18 PM reviewed result with patient. Reassured about pregnancy status. Reviewed need for follow up US.    After the interventions noted above, I reevaluated the patient and found that they have :improved  Dispostion: discharged   Assessment and Plan   1. Abdominal pain affecting pregnancy   2. Pregnancy with uncertain fetal viability, single or unspecified fetus   3. [redacted] weeks gestation of pregnancy    - Recommended repeat US in 7-10 days for pregnancy progression - Reassured that pregnancy is not in fallopian tube - Reviewed return precautions - Message sent to Scheryl Marten RN at [facility]  - Rx for phenergan given to help with nause  Isa Rankin [facility] 04/19/2023, 6:14 PM

## 2023-04-20 LAB — GC/CHLAMYDIA PROBE AMP (~~LOC~~) NOT AT ARMC
Chlamydia: NEGATIVE
Comment: NEGATIVE
Comment: NORMAL
Neisseria Gonorrhea: NEGATIVE

## 2023-04-27 ENCOUNTER — Other Ambulatory Visit (INDEPENDENT_AMBULATORY_CARE_PROVIDER_SITE_OTHER): Payer: Self-pay

## 2023-04-27 ENCOUNTER — Ambulatory Visit: Payer: Medicaid Other | Admitting: *Deleted

## 2023-04-27 VITALS — BP 125/72 | HR 88

## 2023-04-27 DIAGNOSIS — Z3481 Encounter for supervision of other normal pregnancy, first trimester: Secondary | ICD-10-CM

## 2023-04-27 DIAGNOSIS — O3680X0 Pregnancy with inconclusive fetal viability, not applicable or unspecified: Secondary | ICD-10-CM

## 2023-04-27 DIAGNOSIS — Z3A01 Less than 8 weeks gestation of pregnancy: Secondary | ICD-10-CM | POA: Diagnosis not present

## 2023-04-27 NOTE — Progress Notes (Signed)
Dating Scan  I explained I am completing a repeat viability scan today. . We discussed EDD of 12/14/2023, by Last Menstrual Period. Pt is [id number]. I reviewed her allergies, medications and Medical/Surgical/OB history.    Patient Active Problem List   Diagnosis Date Noted   Generalized anxiety disorder 03/08/2019   Depression 03/08/2019   Acute pain of right knee 01/06/2019   Anxiety 05/17/2017   s/p ELAP, left cornual wedge resection & salpingectomy for ruptured cornual ectopic pregnancy on 05/16/17 05/16/2017   Genital herpes     Concerns addressed today  Patient informed that the ultrasound is considered a limited obstetric ultrasound and is not intended to be a complete ultrasound exam.  Patient also informed that the ultrasound is not being completed with the intent of assessing for fetal or placental anomalies or any pelvic abnormalities. Explained that the purpose of today's ultrasound is to assess for viability.  Patient acknowledges the purpose of the exam and the limitations of the study.     Pt had not decided on an OBGYN office, went over locations, and pt would like to go to [place], will send message to front office to get her scheduled for [facility]  Scheryl Marten, RN

## 2023-05-17 ENCOUNTER — Inpatient Hospital Stay (HOSPITAL_COMMUNITY)
Admission: AD | Admit: 2023-05-17 | Discharge: 2023-05-17 | Disposition: A | Discharge status: Home / Self Care | Payer: Medicaid Other | Attending: Obstetrics and Gynecology | Admitting: Obstetrics and Gynecology

## 2023-05-17 ENCOUNTER — Encounter (HOSPITAL_COMMUNITY): Payer: Self-pay | Admitting: Obstetrics and Gynecology

## 2023-05-17 ENCOUNTER — Inpatient Hospital Stay (HOSPITAL_COMMUNITY): Payer: Medicaid Other

## 2023-05-17 DIAGNOSIS — N939 Abnormal uterine and vaginal bleeding, unspecified: Secondary | ICD-10-CM | POA: Diagnosis not present

## 2023-05-17 DIAGNOSIS — O209 Hemorrhage in early pregnancy, unspecified: Secondary | ICD-10-CM | POA: Diagnosis present

## 2023-05-17 DIAGNOSIS — Z3A01 Less than 8 weeks gestation of pregnancy: Secondary | ICD-10-CM

## 2023-05-17 DIAGNOSIS — R252 Cramp and spasm: Secondary | ICD-10-CM | POA: Insufficient documentation

## 2023-05-17 DIAGNOSIS — R109 Unspecified abdominal pain: Secondary | ICD-10-CM | POA: Diagnosis not present

## 2023-05-17 DIAGNOSIS — O021 Missed abortion: Secondary | ICD-10-CM | POA: Diagnosis not present

## 2023-05-17 LAB — ABO/RH: ABO/RH(D): B POS

## 2023-05-17 LAB — CBC
HCT: 34.5 % — ABNORMAL LOW (ref 36.0–46.0)
Hemoglobin: 11.9 g/dL — ABNORMAL LOW (ref 12.0–15.0)
MCH: 30.8 pg (ref 26.0–34.0)
MCHC: 34.5 g/dL (ref 30.0–36.0)
MCV: 89.4 fL (ref 80.0–100.0)
Platelets: 223 10*3/uL (ref 150–400)
RBC: 3.86 MIL/uL — ABNORMAL LOW (ref 3.87–5.11)
RDW: 11.9 % (ref 11.5–15.5)
WBC: 4.3 10*3/uL (ref 4.0–10.5)
nRBC: 0 % (ref 0.0–0.2)

## 2023-05-17 LAB — WET PREP, GENITAL
Sperm: NONE SEEN
Trich, Wet Prep: NONE SEEN
WBC, Wet Prep HPF POC: >=10 — AB (ref <10)
Yeast Wet Prep HPF POC: NONE SEEN

## 2023-05-17 LAB — URINALYSIS, ROUTINE W REFLEX MICROSCOPIC
Bilirubin Urine: NEGATIVE
Glucose, UA: NEGATIVE mg/dL
Ketones, ur: NEGATIVE mg/dL
Leukocytes,Ua: NEGATIVE
Nitrite: NEGATIVE
Protein, ur: NEGATIVE mg/dL
Specific Gravity, Urine: 1.023 (ref 1.005–1.030)
pH: 6 (ref 5.0–8.0)

## 2023-05-17 MED ORDER — ONDANSETRON 4 MG PO TBDP: 4.0000 mg | ORAL_TABLET | Freq: Three times a day (TID) | ORAL | 0 refills | Status: DC | PRN

## 2023-05-17 MED ORDER — KETOROLAC TROMETHAMINE 60 MG/2ML IM SOLN
60.0000 mg | Freq: Once | INTRAMUSCULAR | Status: AC
  Administered 2023-05-17: 60 mg via INTRAMUSCULAR
  Filled 2023-05-17: qty 2

## 2023-05-17 MED ORDER — LOPERAMIDE HCL 2 MG PO TABS: 2.0000 mg | ORAL_TABLET | Freq: Four times a day (QID) | ORAL | 0 refills | Status: DC | PRN

## 2023-05-17 MED ORDER — OXYCODONE HCL 5 MG PO TABS: 5.0000 mg | ORAL_TABLET | ORAL | 0 refills | Status: DC | PRN

## 2023-05-17 MED ORDER — OXYCODONE HCL 5 MG PO TABS
5.0000 mg | ORAL_TABLET | Freq: Once | ORAL | Status: AC
  Administered 2023-05-17: 5 mg via ORAL
  Filled 2023-05-17: qty 1

## 2023-05-17 MED ORDER — ACETAMINOPHEN 500 MG PO TABS
1000.0000 mg | ORAL_TABLET | Freq: Once | ORAL | Status: AC
  Administered 2023-05-17: 1000 mg via ORAL
  Filled 2023-05-17: qty 2

## 2023-05-17 MED ORDER — MISOPROSTOL 200 MCG PO TABS: 800.0000 ug | ORAL_TABLET | Freq: Once | ORAL | 0 refills | Status: DC

## 2023-05-17 NOTE — MAU Provider Note (Signed)
 History     CSN: [id number]  Arrival date and time: 05/17/23 1411   Event Date/Time   First Provider Initiated Contact with Patient 05/17/23 1521      Chief Complaint  Patient presents with   Abdominal Pain   Vaginal Bleeding   Sara Rubio , a  28 y.o. [id number] at [redacted]w[redacted]d presents to MAU with complaints of abdominal cramping and vaginal bleeding. States started on Friday and has continued throughout the weekend. She states that the abdominal pain is a constant lower abdominal cramping that she currently rates as a 8/10 with heat applied. Reports a tylenol PM yesterday that relieved pain. She also reports light, bright red vaginal bleeding that "drips into the toilet" with urination. She also reports passing small dime sized clots. She denies abnormal vaginal discharge, urinary symptoms or problems with N/V or Constipation or diarrhea. Upon arrival, she states that her pad was clean.          OB History     Gravida  6   Para  1   Term  1   Preterm      AB  3   Living  1      SAB      IAB  2   Ectopic  1   Multiple  0   Live Births  1           Past Medical History:  Diagnosis Date   Anxiety 05/17/2017   Genital herpes    Headache    Left cornual ectopic pregnancy 05/16/2017   8 week viable cornual ruptured ectopic pregnancy with ~500 ml of hemoperitoneum -> wedge resection Needs cesarean deliveries at 37 weeks for any subsequent pregnancies; labor is absolutely contraindicated.   s/p ELAP, left cornual wedge resection & salpingectomy for ruptured cornual ectopic pregnancy on 05/16/17 05/16/2017   Seasonal allergies     Past Surgical History:  Procedure Laterality Date   LAPAROSCOPY N/A 05/16/2017   Procedure: LAPAROSCOPY DIAGNOSTIC;  Surgeon: Tereso Newcomer, MD;  Location: [facility];  Service: Gynecology;  Laterality: N/A;   LAPAROTOMY N/A 05/16/2017   Procedure: EXPLORATORY LAPAROTOMY, LEFT CORNUAL WEDGE RESECTION ECTOPIC PREGNANCY;  Surgeon: Tereso Newcomer, MD;  Location: [facility];  Service: Gynecology;  Laterality: N/A;   UNILATERAL SALPINGECTOMY Left 05/16/2017   Procedure: UNILATERAL SALPINGECTOMY;  Surgeon: Tereso Newcomer, MD;  Location: [facility];  Service: Gynecology;  Laterality: Left;   WISDOM TOOTH EXTRACTION      Family History  Problem Relation Age of Onset   Lupus Mother    Other Mother        prediabetes   Diabetes Father        B leg amputation   Kidney disease Father    Diabetes Paternal Grandmother    Hypertension Paternal Grandmother    Diabetes Paternal Grandfather    Hypertension Paternal Grandfather    Diabetes Cousin    Hyperlipidemia Cousin     Social History   Tobacco Use   Smoking status: Former    Current packs/day: 0.25    Average packs/day: 0.3 packs/day for 5.0 years (1.3 ttl pk-yrs)    Types: Cigarettes   Smokeless tobacco: Never  Vaping Use   Vaping status: Never Used  Substance Use Topics   Alcohol use: Yes    Comment: weekends   Drug use: Not Currently    Types: Marijuana    Comment: last useDec 1 2015    Allergies: No Known Allergies  Medications Prior to Admission  Medication Sig Dispense Refill Last Dose/Taking   promethazine (PHENERGAN) 12.5 MG tablet Take 1 tablet (12.5 mg total) by mouth every 6 (six) hours as needed for nausea or vomiting. 30 tablet 2 Unknown   sertraline (ZOLOFT) 25 MG tablet Take one tablet daily. If tolerating, increase to two tablets in one week. (Patient not taking: Reported on 04/27/2023) 180 tablet 0 Unknown    Review of Systems  Constitutional:  Negative for chills, fatigue and fever.  Eyes:  Negative for pain and visual disturbance.  Respiratory:  Negative for apnea, shortness of breath and wheezing.   Cardiovascular:  Negative for chest pain and palpitations.  Gastrointestinal:  Negative for abdominal pain, constipation, diarrhea, nausea and vomiting.  Genitourinary:  Positive for pelvic pain and vaginal bleeding. Negative for difficulty  urinating, dysuria, vaginal discharge and vaginal pain.  Musculoskeletal:  Negative for back pain.  Neurological:  Negative for seizures, weakness and headaches.  Psychiatric/Behavioral:  Negative for suicidal ideas.    Physical Exam   Blood pressure 124/67, pulse 93, temperature 98.5 F (36.9 C), temperature source Oral, resp. rate 17, height 5\' 8"  (1.727 m), weight 93.7 kg, last menstrual period 03/09/2023, SpO2 99%.  Physical Exam Vitals and nursing note reviewed.  Constitutional:      General: She is not in acute distress.    Appearance: Normal appearance.  HENT:     Head: Normocephalic.  Pulmonary:     Effort: Pulmonary effort is normal.  Abdominal:     Tenderness: There is abdominal tenderness in the right upper quadrant and left upper quadrant. There is guarding.  Musculoskeletal:     Cervical back: Normal range of motion.  Skin:    General: Skin is warm and dry.  Neurological:     Mental Status: She is alert and oriented to person, place, and time.  Psychiatric:        Mood and Affect: Mood normal.     MAU Course  Procedures Orders Placed This Encounter  Procedures   Wet prep, genital   US OB LESS THAN 14 WEEKS WITH OB TRANSVAGINAL   Urinalysis, Routine w reflex microscopic -Urine, Clean Catch   CBC   Diet NPO time specified   ABO/Rh   Meds ordered this encounter  Medications   acetaminophen (TYLENOL) tablet 1,000 mg   Results for orders placed or performed during the hospital encounter of 05/17/23 (from the past 24 hours)  GC/Chlamydia probe amp ([place])not at [facility]     Status: None   Collection Time: 05/17/23  2:45 PM  Result Value Ref Range   Neisseria Gonorrhea Negative    Chlamydia Negative    Comment Normal Reference Ranger Chlamydia - Negative    Comment      Normal Reference Range Neisseria Gonorrhea - Negative  Urinalysis, Routine w reflex microscopic -Urine, Clean Catch     Status: Abnormal   Collection Time: 05/17/23  2:57 PM  Result  Value Ref Range   Color, Urine YELLOW YELLOW   APPearance CLEAR CLEAR   Specific Gravity, Urine 1.023 1.005 - 1.030   pH 6.0 5.0 - 8.0   Glucose, UA NEGATIVE NEGATIVE mg/dL   Hgb urine dipstick SMALL (A) NEGATIVE   Bilirubin Urine NEGATIVE NEGATIVE   Ketones, ur NEGATIVE NEGATIVE mg/dL   Protein, ur NEGATIVE NEGATIVE mg/dL   Nitrite NEGATIVE NEGATIVE   Leukocytes,Ua NEGATIVE NEGATIVE   RBC / HPF 0-5 0 - 5 RBC/hpf   WBC, UA 0-5 0 -  5 WBC/hpf   Bacteria, UA RARE (A) NONE SEEN   Squamous Epithelial / HPF 0-5 0 - 5 /HPF   Mucus PRESENT   ABO/Rh     Status: None   Collection Time: 05/17/23  3:51 PM  Result Value Ref Range   ABO/RH(D) B POS    No rh immune globuloin      NOT A RH IMMUNE GLOBULIN CANDIDATE, PT RH POSITIVE Performed at [facility], [address], [place], [place] [address]   CBC     Status: Abnormal   Collection Time: 05/17/23  3:52 PM  Result Value Ref Range   WBC 4.3 4.0 - 10.5 K/uL   RBC 3.86 (L) 3.87 - 5.11 MIL/uL   Hemoglobin 11.9 (L) 12.0 - 15.0 g/dL   HCT 60.4 (L) 54.0 - 98.1 %   MCV 89.4 80.0 - 100.0 fL   MCH 30.8 26.0 - 34.0 pg   MCHC 34.5 30.0 - 36.0 g/dL   RDW 19.1 47.8 - 29.5 %   Platelets 223 150 - 400 K/uL   nRBC 0.0 0.0 - 0.2 %  Wet prep, genital     Status: Abnormal   Collection Time: 05/17/23  4:21 PM  Result Value Ref Range   Yeast Wet Prep HPF POC NONE SEEN NONE SEEN   Trich, Wet Prep NONE SEEN NONE SEEN   Clue Cells Wet Prep HPF POC PRESENT (A) NONE SEEN   WBC, Wet Prep HPF POC >=10 (A) <10   Sperm NONE SEEN    US OB LESS THAN 14 WEEKS WITH OB TRANSVAGINAL Result Date: 05/17/2023 CLINICAL DATA:  Vaginal bleeding and cramping in 1st trimester pregnancy. EXAM: OBSTETRIC <14 WK [place] AND TRANSVAGINAL OB US TECHNIQUE: Both transabdominal and transvaginal ultrasound examinations were performed for complete evaluation of the gestation as well as the maternal uterus, adnexal regions, and pelvic cul-de-sac. Transvaginal technique was performed  to assess early pregnancy. COMPARISON:  04/19/2023 FINDINGS: Intrauterine gestational sac: Single, with elongated irregular sac shape noted Yolk sac:  Not Visualized. Embryo:  Visualized. Cardiac Activity: Not Visualized. CRL:  10 mm   7 w   1 d                  [place] EDC: 01/02/2024 Subchorionic hemorrhage:  None visualized. Maternal uterus/adnexae: Both ovaries are normal in appearance. A cystic lesion is again seen adjacent to the left ovary which has a tubular appearance, unchanged since previous study and consistent with hydrosalpinx. No evidence of free fluid. IMPRESSION: Findings meet definitive criteria for failed pregnancy. This follows SRU consensus guidelines: Diagnostic Criteria for Nonviable Pregnancy Early in the First Trimester. [facility] Mis J Med [phone]. Left hydrosalpinx again noted. Electronically Signed   By: Danae Orleans M.D.   On: 05/17/2023 17:00     MDM - Recommended a pelvic exam to assess bleeding, and patient declined. Patient to be sent for [place].  - PO Tylenol ordered for pain.  - Hgb 11.9- clinical stable at this time  - US imaging noted a failed pregnancy. Likely the start of a miscarriage.  - plan for discharge.   Assessment and Plan   1. Missed abortion   2. [redacted] weeks gestation of pregnancy   3. Vaginal bleeding   4. Abdominal cramping    - condolences given  - Reviewed dx of failed pregnancy and options of D&E vs Expectant management vs Cytotec. Reviewed risks and benefits of each. Patient undecided on which option she would like at this  time, but is leaning to Meds.  - Rx for Cytotec, zofran, imodium, and short course of oxycodone sent to outpatient pharmacy. Admin instructions given at bedside.  - Recommended to follow up with OP OBGYN on decision.   - Message sent to [facility] to get scheduled for SAB follow up.  - Patient discharged home in stable condition and may return to MAU as needed.   Claudette Head, MSN CNM  05/17/2023, 3:21 PM

## 2023-05-17 NOTE — MAU Note (Signed)
.  Sara Rubio is a 28 y.o. at [redacted]w[redacted]d here in MAU reporting: abd cramping and light bleeding for the past 2 days.  Denies recent intercourse.  Denies pain with urination.  Has not taken any medication for pain.   Onset of complaint: 2 days Pain score: 8 Vitals:   05/17/23 1440  BP: (!) 127/54  Pulse: 90  Resp: 17  Temp: 98.5 F (36.9 C)  SpO2: 99%   Lab orders placed from triage: ua

## 2023-05-18 LAB — GC/CHLAMYDIA PROBE AMP (~~LOC~~) NOT AT ARMC
Chlamydia: NEGATIVE
Comment: NEGATIVE
Comment: NORMAL
Neisseria Gonorrhea: NEGATIVE

## 2023-06-08 ENCOUNTER — Encounter: Payer: Self-pay | Admitting: Obstetrics & Gynecology

## 2023-06-08 ENCOUNTER — Ambulatory Visit: Payer: Medicaid Other | Admitting: Obstetrics & Gynecology

## 2023-06-08 VITALS — BP 128/81 | HR 80 | Ht 68.0 in | Wt 206.0 lb

## 2023-06-08 DIAGNOSIS — O039 Complete or unspecified spontaneous abortion without complication: Secondary | ICD-10-CM

## 2023-06-08 DIAGNOSIS — R102 Pelvic and perineal pain unspecified side: Secondary | ICD-10-CM

## 2023-06-08 DIAGNOSIS — Z3A01 Less than 8 weeks gestation of pregnancy: Secondary | ICD-10-CM | POA: Diagnosis not present

## 2023-06-08 DIAGNOSIS — Z3009 Encounter for other general counseling and advice on contraception: Secondary | ICD-10-CM

## 2023-06-08 NOTE — Patient Instructions (Signed)
Human Papillomavirus (HPV) Vaccine Injection What is this medication? HUMAN PAPILLOMAVIRUS VACCINE (HYOO muhn pap uh LOH muh vahy ruhs vak SEEN) reduces the risk of human papillomavirus (HPV). It does not treat HPV. It is still possible to get HPV after receiving this vaccine, but the symptoms may be less severe or not last as long. It works by helping your immune system learn how to fight off a future infection. This medicine may be used for other purposes; ask your health care provider or pharmacist if you have questions. COMMON BRAND NAME(S): Gardasil 9 What should I tell my care team before I take this medication? They need to know if you have any of these conditions: Fever Hemophilia HIV or AIDS Immune system problems Infection Low platelets An unusual reaction to human papillomavirus vaccine, yeast, other vaccines, other medications, foods, dyes, or preservatives Pregnant or trying to get pregnant Breastfeeding How should I use this medication? This vaccine is injected into a muscle. It is given by your care team. This vaccine requires 2 or 3 doses to get the full benefit. Set a reminder for when your next dose is due. A copy of the Vaccine Information Statement will be given before each vaccination. Be sure to read this information carefully each time. This sheet may change often. Talk to your care team about the use of this medication in children. While it may be prescribed for children as young as 9 years for selected conditions, precautions do apply. Overdosage: If you think you have taken too much of this medicine contact a poison control center or emergency room at once. NOTE: This medicine is only for you. Do not share this medicine with others. What if I miss a dose? Keep appointments for follow-up doses as directed. It is important not to miss your dose. Call your care team if you are unable to keep an appointment. What may interact with this medication? Certain medications  for arthritis Medications for organ transplant Medications to treat cancer Steroid medications, such as prednisone or cortisone This list may not describe all possible interactions. Give your health care provider a list of all the medicines, herbs, non-prescription drugs, or dietary supplements you use. Also tell them if you smoke, drink alcohol, or use illegal drugs. Some items may interact with your medicine. What should I watch for while using this medication? Visit your care team regularly. Report any side effects to your care team right away. This vaccine, like all vaccines, may not fully protect everyone. What side effects may I notice from receiving this medication? Side effects that you should report to your care team as soon as possible: Allergic reactions--skin rash, itching, hives, swelling of the face, lips, tongue, or throat Feeling faint or lightheaded Side effects that usually do not require medical attention (report these to your care team if they continue or are bothersome): Diarrhea Dizziness Fatigue Fever Headache Nausea Pain, redness, irritation, or bruising at the injection site This list may not describe all possible side effects. Call your doctor for medical advice about side effects. You may report side effects to FDA at 1-800-FDA-1088. Where should I keep my medication? This vaccine is only given by your care team. It will not be stored at home. NOTE: This sheet is a summary. It may not cover all possible information. If you have questions about this medicine, talk to your doctor, pharmacist, or health care provider.  2024 Elsevier/Gold Standard (2021-08-21 00:00:00)

## 2023-06-08 NOTE — Progress Notes (Signed)
    GYNECOLOGY PROGRESS NOTE  Subjective:    Patient ID: Sara Rubio, female    DOB: 17-Apr-1995, 28 y.o.   MRN: [id number]  HPI  Patient is a 28 y.o. [id number] here for follow up after missed ab/miscarriage. She was initially seen at the MAU on 04/19/2023 at 5 weeks 6 days with cramping and + UPT. She has a h/o ectopics so an ultrasound was done and it showed an itrauterine sac. She was then seen on 05/17/2023 with bleeding. Her ultrasound that day met criteria for a failed pregnancy. She was given a prescription for cytotec but she never took it. She reports that she did pass tissue. She reports that she finished bleeding last week.  She had sex yesterday and used withdrawal for contraception.  She and her partner say that they don't want a pregnancy at the present time. She is unwilling to use any contraception at this time except maybe condoms/withdrawal.  She reports abdominal pain and constipation. She took castor oil yesterday but no results.  The following portions of the patient's history were reviewed and updated as appropriate: allergies, current medications, past family history, past medical history, past social history, past surgical history, and problem list.  Review of Systems Pertinent items are noted in HPI.  Her last pap was in 2016.  Objective:   Blood pressure 128/81, pulse 80, height 5\' 8"  (1.727 m), weight 206 lb (93.4 kg), last menstrual period 03/09/2023, unknown if currently breastfeeding. Body mass index is 31.32 kg/m. Well nourished, well hydrated Black female, no apparent distress She is ambulating and conversing normally. She declines a speculum exam ("I don't like those things"). She does agree to a bimanual exam Tremendous amount of voluntary guarding. I was unable to examine her uterus.   Assessment:   S/p miscarriage- I have rec'd waiting at least several months between pregnancies.  I will order a pelvic ultrasound to confirm complete uterine  emptying.  Contraception counseling done. I have rec'd Gardasil. At this time she refuses.  I strongly rec'd that she schedule an appt for an annual exam/pap smear.  She will follow up after ultrasound results are available.  I have rec metamucil, miralax, stool softeners for constipation.   Plan:    As above

## 2023-06-08 NOTE — Progress Notes (Signed)
 No menses yet. Pelvic cramping esp with coughing.

## 2023-06-22 ENCOUNTER — Ambulatory Visit (HOSPITAL_COMMUNITY)

## 2023-06-28 ENCOUNTER — Ambulatory Visit (HOSPITAL_BASED_OUTPATIENT_CLINIC_OR_DEPARTMENT_OTHER)

## 2023-07-04 ENCOUNTER — Other Ambulatory Visit (HOSPITAL_BASED_OUTPATIENT_CLINIC_OR_DEPARTMENT_OTHER)

## 2023-07-04 ENCOUNTER — Ambulatory Visit (HOSPITAL_BASED_OUTPATIENT_CLINIC_OR_DEPARTMENT_OTHER)

## 2023-07-05 ENCOUNTER — Ambulatory Visit (HOSPITAL_BASED_OUTPATIENT_CLINIC_OR_DEPARTMENT_OTHER): Admission: RE | Admit: 2023-07-05 | Source: Ambulatory Visit

## 2023-07-05 ENCOUNTER — Encounter (HOSPITAL_BASED_OUTPATIENT_CLINIC_OR_DEPARTMENT_OTHER): Payer: Self-pay

## 2023-07-06 ENCOUNTER — Encounter: Payer: Self-pay | Admitting: Obstetrics & Gynecology

## 2023-07-06 ENCOUNTER — Other Ambulatory Visit (HOSPITAL_COMMUNITY)
Admission: RE | Admit: 2023-07-06 | Discharge: 2023-07-06 | Disposition: A | Discharge status: Home / Self Care | Source: Ambulatory Visit | Attending: Obstetrics & Gynecology | Admitting: Obstetrics & Gynecology

## 2023-07-06 ENCOUNTER — Ambulatory Visit: Admitting: Obstetrics & Gynecology

## 2023-07-06 VITALS — BP 134/91 | HR 85 | Ht 68.0 in | Wt 208.0 lb

## 2023-07-06 DIAGNOSIS — Z01419 Encounter for gynecological examination (general) (routine) without abnormal findings: Secondary | ICD-10-CM | POA: Insufficient documentation

## 2023-07-06 NOTE — Progress Notes (Signed)
 GYNECOLOGY ANNUAL PREVENTATIVE CARE ENCOUNTER NOTE  History:     Sara Rubio is a 28 y.o. [phone] female here for a routine annual gynecologic exam.  Current complaints: none. Was seen last month for pain, did not get recommended ultrasound as she felt her symptoms were gastrointestinal.  Denies abnormal vaginal bleeding, discharge, pelvic pain, problems with intercourse or other gynecologic concerns.    Gynecologic History Patient's last menstrual period was 06/23/2023. Contraception: condoms and rhythm method Last Pap: 2016. Result was normal   Obstetric History OB History  Gravida Para Term Preterm AB Living  6 1 1  3 1   SAB IAB Ectopic Multiple Live Births   2 1 0 1    # Outcome Date GA Lbr Len/2nd Weight Sex Type Anes PTL Lv  6 Gravida           5 Term 10/20/14 [redacted]w[redacted]d / 00:44 7 lb 1.6 oz (3.221 kg) F Vag-Spont EPI  LIV  4 Gravida           3 IAB           2 IAB           1 Ectopic             Past Medical History:  Diagnosis Date   Depression 03/08/2019   Generalized anxiety disorder 03/08/2019   Genital herpes    Left cornual ectopic pregnancy 05/16/2017   8 week viable cornual ruptured ectopic pregnancy with ~500 ml of hemoperitoneum -> wedge resection Needs cesarean deliveries at 37 weeks for any subsequent pregnancies; labor is absolutely contraindicated.    Past Surgical History:  Procedure Laterality Date   LAPAROSCOPY N/A 05/16/2017   Procedure: LAPAROSCOPY DIAGNOSTIC;  Surgeon: Tereso Newcomer, MD;  Location: [facility];  Service: Gynecology;  Laterality: N/A;   LAPAROTOMY N/A 05/16/2017   Procedure: EXPLORATORY LAPAROTOMY, LEFT CORNUAL WEDGE RESECTION ECTOPIC PREGNANCY;  Surgeon: Tereso Newcomer, MD;  Location: [facility];  Service: Gynecology;  Laterality: N/A;   UNILATERAL SALPINGECTOMY Left 05/16/2017   Procedure: UNILATERAL SALPINGECTOMY;  Surgeon: Tereso Newcomer, MD;  Location: [facility];  Service: Gynecology;  Laterality: Left;   WISDOM TOOTH  EXTRACTION      Current Outpatient Medications on File Prior to Visit  Medication Sig Dispense Refill   loperamide (IMODIUM A-D) 2 MG tablet Take 1 tablet (2 mg total) by mouth 4 (four) times daily as needed for diarrhea or loose stools. (Patient not taking: Reported on 06/08/2023) 30 tablet 0   misoprostol (CYTOTEC) 200 MCG tablet Take 4 tablets (800 mcg total) by mouth once for 1 dose. 4 tablet 0   ondansetron (ZOFRAN-ODT) 4 MG disintegrating tablet Take 1 tablet (4 mg total) by mouth every 8 (eight) hours as needed for nausea or vomiting. (Patient not taking: Reported on 06/08/2023) 15 tablet 0   oxyCODONE (OXY IR/ROXICODONE) 5 MG immediate release tablet Take 1 tablet (5 mg total) by mouth every 4 (four) hours as needed for severe pain (pain score 7-10). (Patient not taking: Reported on 06/08/2023) 5 tablet 0   promethazine (PHENERGAN) 12.5 MG tablet Take 1 tablet (12.5 mg total) by mouth every 6 (six) hours as needed for nausea or vomiting. (Patient not taking: Reported on 06/08/2023) 30 tablet 2   sertraline (ZOLOFT) 25 MG tablet Take one tablet daily. If tolerating, increase to two tablets in one week. (Patient not taking: Reported on 06/08/2023) 180 tablet 0   No current facility-administered medications on file prior  to visit.    No Known Allergies  Social History:  reports that she has quit smoking. Her smoking use included cigarettes. She has a 1.3 pack-year smoking history. She has never used smokeless tobacco. She reports current alcohol use. She reports that she does not currently use drugs after having used the following drugs: Marijuana.  Family History  Problem Relation Age of Onset   Lupus Mother    Other Mother        prediabetes   Diabetes Father        B leg amputation   Kidney disease Father    Diabetes Paternal Grandmother    Hypertension Paternal Grandmother    Diabetes Paternal Grandfather    Hypertension Paternal Grandfather    Diabetes Cousin    Hyperlipidemia  Cousin     The following portions of the patient's history were reviewed and updated as appropriate: allergies, current medications, past family history, past medical history, past social history, past surgical history and problem list.  Review of Systems Pertinent items noted in HPI and remainder of comprehensive ROS otherwise negative.  Physical Exam:  BP (!) 134/91   Pulse 85   Ht 5\' 8"  (1.727 m)   Wt 208 lb (94.3 kg)   LMP 06/23/2023   BMI 31.63 kg/m  CONSTITUTIONAL: Well-developed, well-nourished female in no acute distress.  HENT:  Normocephalic, atraumatic, External right and left ear normal.  EYES: Conjunctivae and EOM are normal. Pupils are equal, round, and reactive to light. No scleral icterus.  NECK: Normal range of motion, supple, no masses observed. SKIN: Skin is warm and dry. No rash noted. Not diaphoretic. No erythema. No pallor. MUSCULOSKELETAL: Normal range of motion. No tenderness.  No cyanosis, clubbing, or edema. NEUROLOGIC: Alert and oriented to person, place, and time. Normal muscle tone coordination.  PSYCHIATRIC: Normal mood and affect. Normal behavior. Normal judgment and thought content. CARDIOVASCULAR: Normal heart rate noted, regular rhythm RESPIRATORY: Clear to auscultation bilaterally. Effort and breath sounds normal, no problems with respiration noted. BREASTS: Symmetric in size. No masses, tenderness, skin changes, nipple drainage, or lymphadenopathy bilaterally. Performed in the presence of a chaperone. ABDOMEN: Soft, no distention noted.  No tenderness, rebound or guarding.  PELVIC: Normal appearing external genitalia and urethral meatus; normal appearing vaginal mucosa and cervix.  No abnormal vaginal discharge noted.  Pap smear obtained.  Normal uterine size, no other palpable masses, no uterine or adnexal tenderness.  Patient was very uncomfortable with pelvic exam overall, had to use plastic Pedersen speculum and she ws still rather uncomfortable.  Performed in the presence of a chaperone.   Assessment and Plan:     1. Well woman exam with routine gynecological exam (Primary) - Cytology - PAP Will follow up results of pap smear and manage accordingly. Normal breast examination today, she was advised to perform periodic self breast examinations.  Routine preventative health maintenance measures emphasized. Please refer to After Visit Summary for other counseling recommendations.      Lenoard Rad, MD, FACOG Obstetrician & Gynecologist, [facility] [employer], [facility]

## 2023-07-10 LAB — CYTOLOGY - PAP
Comment: NEGATIVE
Diagnosis: UNDETERMINED — AB
High risk HPV: NEGATIVE

## 2023-07-14 ENCOUNTER — Encounter: Payer: Self-pay | Admitting: Obstetrics & Gynecology

## 2024-02-09 ENCOUNTER — Ambulatory Visit

## 2024-02-09 VITALS — BP 138/79 | HR 99 | Wt 216.6 lb

## 2024-02-09 DIAGNOSIS — Z3200 Encounter for pregnancy test, result unknown: Secondary | ICD-10-CM | POA: Diagnosis not present

## 2024-02-09 DIAGNOSIS — Z8759 Personal history of other complications of pregnancy, childbirth and the puerperium: Secondary | ICD-10-CM

## 2024-02-09 DIAGNOSIS — Z3A01 Less than 8 weeks gestation of pregnancy: Secondary | ICD-10-CM

## 2024-02-09 LAB — POCT URINE PREGNANCY: Preg Test, Ur: POSITIVE — AB

## 2024-02-09 MED ORDER — VITAFOL ULTRA 29-0.6-0.4-200 MG PO CAPS: 1.0000 | ORAL_CAPSULE | Freq: Every day | ORAL | 11 refills | Status: DC

## 2024-02-09 NOTE — Progress Notes (Signed)
..  Sara Rubio presents today for UPT. She has no unusual complaints. LMP: 12/28/23 Pt reports hx of 2 ectopic pregnancies in 2019, 2020    OBJECTIVE: Appears well, in no apparent distress.  OB History     Gravida  7   Para  1   Term  1   Preterm      AB  5   Living  1      SAB  2   IAB  1   Ectopic  2   Multiple  0   Live Births  1          Home UPT Result: Positive In-Office UPT result: Positive I have reviewed the patient's medical, obstetrical, social, and family histories, and medications.   ASSESSMENT: Positive pregnancy test  PLAN Prenatal care to be completed at: [facility] with Dr. Erik in office and ordered early US  to rule out ectopic Provided safe med list PNV sent to pharmacy

## 2024-02-22 ENCOUNTER — Ambulatory Visit (HOSPITAL_COMMUNITY)
Admission: RE | Admit: 2024-02-22 | Discharge: 2024-02-22 | Disposition: A | Discharge status: Home / Self Care | Source: Ambulatory Visit | Attending: Obstetrics and Gynecology | Admitting: Obstetrics and Gynecology

## 2024-02-22 ENCOUNTER — Other Ambulatory Visit: Payer: Self-pay | Admitting: Obstetrics and Gynecology

## 2024-02-22 DIAGNOSIS — Z8759 Personal history of other complications of pregnancy, childbirth and the puerperium: Secondary | ICD-10-CM | POA: Insufficient documentation

## 2024-02-22 DIAGNOSIS — Z3A01 Less than 8 weeks gestation of pregnancy: Secondary | ICD-10-CM | POA: Diagnosis present

## 2024-02-22 DIAGNOSIS — Z3200 Encounter for pregnancy test, result unknown: Secondary | ICD-10-CM

## 2024-02-23 ENCOUNTER — Encounter

## 2024-02-26 ENCOUNTER — Encounter

## 2024-02-29 ENCOUNTER — Ambulatory Visit: Payer: Self-pay | Admitting: Obstetrics and Gynecology

## 2024-03-02 ENCOUNTER — Other Ambulatory Visit (INDEPENDENT_AMBULATORY_CARE_PROVIDER_SITE_OTHER): Payer: Self-pay

## 2024-03-02 ENCOUNTER — Ambulatory Visit

## 2024-03-02 VITALS — BP 130/84 | HR 89 | Wt 212.9 lb

## 2024-03-02 DIAGNOSIS — O0991 Supervision of high risk pregnancy, unspecified, first trimester: Secondary | ICD-10-CM

## 2024-03-02 DIAGNOSIS — O219 Vomiting of pregnancy, unspecified: Secondary | ICD-10-CM

## 2024-03-02 DIAGNOSIS — B009 Herpesviral infection, unspecified: Secondary | ICD-10-CM | POA: Insufficient documentation

## 2024-03-02 DIAGNOSIS — Z3A09 9 weeks gestation of pregnancy: Secondary | ICD-10-CM

## 2024-03-02 DIAGNOSIS — Z362 Encounter for other antenatal screening follow-up: Secondary | ICD-10-CM | POA: Diagnosis not present

## 2024-03-02 DIAGNOSIS — O0990 Supervision of high risk pregnancy, unspecified, unspecified trimester: Secondary | ICD-10-CM | POA: Insufficient documentation

## 2024-03-02 MED ORDER — DOXYLAMINE-PYRIDOXINE 10-10 MG PO TBEC: 2.0000 | DELAYED_RELEASE_TABLET | Freq: Every day | ORAL | 5 refills | Status: AC

## 2024-03-02 MED ORDER — PROMETHAZINE HCL 25 MG PO TABS: 25.0000 mg | ORAL_TABLET | Freq: Four times a day (QID) | ORAL | 1 refills | Status: AC | PRN

## 2024-03-02 MED ORDER — BLOOD PRESSURE KIT DEVI: 1.0000 | 0 refills | Status: AC

## 2024-03-02 NOTE — Patient Instructions (Signed)
[facility] has a partnership with the [employer] to provide prenatal navigation for the most needed resources in our community. In order to see how we can help connect you to these resources we need consent to contact you. Please complete the very short consent using the link below:   English Link: [email or web address]  Spanish Link: [email or web address]  

## 2024-03-02 NOTE — Progress Notes (Signed)
 New OB Intake  I connected with Sara Rubio  on 03/02/24 at  1:10 PM EST by In Person Visit and verified that I am speaking with the correct person using two identifiers. Nurse is located at [facility] and pt is located at [place].  I discussed the limitations, risks, security and privacy concerns of performing an evaluation and management service by telephone and the availability of in person appointments. I also discussed with the patient that there may be a patient responsible charge related to this service. The patient expressed understanding and agreed to proceed.  I explained I am completing New OB Intake today. We discussed EDD of 10/03/24 based on LMP of 12/28/23. Pt is [id number]. I reviewed her allergies, medications and Medical/Surgical/OB history.    Patient Active Problem List   Diagnosis Date Noted   Generalized anxiety disorder 03/08/2019   Depression 03/08/2019     Concerns addressed today  Delivery Plans Plans to deliver at [facility] Discussed the nature of our practice with multiple providers including residents and students as well as female and female providers. Due to the size of the practice, the delivering provider may not be the same as those providing prenatal care.    MyChart/Babyscripts MyChart access verified. I explained pt will have some visits in office and some virtually. Babyscripts instructions given and order placed. Patient verifies receipt of registration text/e-mail. Account successfully created and app downloaded. If patient is a candidate for Optimized scheduling, add to sticky note.   Blood Pressure Cuff/Weight Scale Blood pressure cuff ordered for patient to pick-up from [employer]. Explained after first prenatal appt pt will check weekly and document in Babyscripts. Patient does not have weight scale; patient may purchase if they desire to track weight weekly in Babyscripts.  Anatomy US  Explained first scheduled US  will be around 19 weeks.  Anatomy US  scheduled for 05/10/24 at 2:00 PM.  Is patient a candidate for Babyscripts Optimization? No, due to Risk Factors   First visit review I reviewed new OB appt with patient. Explained pt will be seen by Dr. Alger at first visit. Discussed [place] genetic screening with patient. Requests Panorama and Horizon.. Routine prenatal labs deferred to New OB visit.   Last Pap Diagnosis  Date Value Ref Range Status  07/06/2023 (A)  Final   - Atypical squamous cells of undetermined significance (ASC-US )    Rocky Sara Ober, RN 03/02/2024  1:51 PM

## 2024-03-04 ENCOUNTER — Other Ambulatory Visit (HOSPITAL_COMMUNITY)
Admission: RE | Admit: 2024-03-04 | Discharge: 2024-03-04 | Disposition: A | Source: Ambulatory Visit | Attending: Obstetrics and Gynecology | Admitting: Obstetrics and Gynecology

## 2024-03-04 NOTE — Addendum Note (Signed)
 Addended by: Jewell Haught V on: 03/04/2024 12:07 PM   Modules accepted: Orders

## 2024-03-07 ENCOUNTER — Inpatient Hospital Stay (HOSPITAL_COMMUNITY)
Admission: AD | Admit: 2024-03-07 | Discharge: 2024-03-07 | Disposition: A | Discharge status: Home / Self Care | Attending: Obstetrics & Gynecology | Admitting: Obstetrics & Gynecology

## 2024-03-07 ENCOUNTER — Other Ambulatory Visit: Payer: Self-pay

## 2024-03-07 DIAGNOSIS — O26891 Other specified pregnancy related conditions, first trimester: Secondary | ICD-10-CM

## 2024-03-07 DIAGNOSIS — Z3A10 10 weeks gestation of pregnancy: Secondary | ICD-10-CM

## 2024-03-07 DIAGNOSIS — R109 Unspecified abdominal pain: Secondary | ICD-10-CM | POA: Diagnosis not present

## 2024-03-07 DIAGNOSIS — R102 Pelvic and perineal pain unspecified side: Secondary | ICD-10-CM

## 2024-03-07 DIAGNOSIS — O219 Vomiting of pregnancy, unspecified: Secondary | ICD-10-CM

## 2024-03-07 LAB — URINALYSIS, ROUTINE W REFLEX MICROSCOPIC
Bacteria, UA: NONE SEEN
Bilirubin Urine: NEGATIVE
Glucose, UA: NEGATIVE mg/dL
Hgb urine dipstick: NEGATIVE
Ketones, ur: NEGATIVE mg/dL
Leukocytes,Ua: NEGATIVE
Nitrite: NEGATIVE
Protein, ur: 30 mg/dL — AB
Specific Gravity, Urine: 1.029 (ref 1.005–1.030)
pH: 5 (ref 5.0–8.0)

## 2024-03-07 LAB — CERVICOVAGINAL ANCILLARY ONLY
Chlamydia: NEGATIVE
Comment: NEGATIVE
Comment: NEGATIVE
Comment: NORMAL
Neisseria Gonorrhea: NEGATIVE
Trichomonas: NEGATIVE

## 2024-03-07 MED ORDER — PROCHLORPERAZINE MALEATE 10 MG PO TABS: 10.0000 mg | ORAL_TABLET | Freq: Four times a day (QID) | ORAL | 3 refills | Status: AC | PRN

## 2024-03-07 MED ORDER — SCOPOLAMINE 1 MG/3DAYS TD PT72: 1.0000 | MEDICATED_PATCH | TRANSDERMAL | 1 refills | Status: AC

## 2024-03-07 MED ORDER — METOCLOPRAMIDE HCL 10 MG PO TABS: 10.0000 mg | ORAL_TABLET | Freq: Four times a day (QID) | ORAL | 2 refills | Status: AC | PRN

## 2024-03-07 MED ORDER — ONDANSETRON 4 MG PO TBDP
8.0000 mg | ORAL_TABLET | Freq: Once | ORAL | Status: AC
  Administered 2024-03-07: 14:00:00 8 mg via ORAL
  Filled 2024-03-07: qty 2

## 2024-03-07 MED ORDER — ONDANSETRON 4 MG PO TBDP: 4.0000 mg | ORAL_TABLET | Freq: Four times a day (QID) | ORAL | 3 refills | Status: AC | PRN

## 2024-03-07 NOTE — MAU Note (Signed)
.  Sara Rubio is a 28 y.o. at [redacted]w[redacted]d here in MAU reporting: cramping nausea and vomiting for 2 weeks   Onset of complaint: 2 weeks Pain score: 6/10 There were no vitals filed for this visit.   QYU:juuzfeuzi in triage  Lab orders placed from triage:   ua

## 2024-03-07 NOTE — MAU Provider Note (Signed)
 History     [id number]  Arrival date and time: 03/07/24 1220    Chief Complaint  Patient presents with   Emesis   Nausea     HPI Sara SCHORSCH is a 28 y.o. at [redacted]w[redacted]d by 8 wk US  with PMHx notable for cesarean x1, HSV, who presents for nausea, vomiting, and cramping.   Reports a bit of cramping Comes and goes Had viability and dating US  on 03/02/2024 that was normal No vaginal bleeding, discharge, or leaking fluid  Feels like she's dehydrated Has been taking diclegis  and phenergan  but doesn't help much with nausea, just makes her feel sleepy Not sure if she has had zofran  Having trouble with both liquids and solids Endorses some runny nose and body aches No fevers No sick contacts  Later admits she is terrified about the health of the baby and would like to make sure its ok, as she reports multiple miscarriages   --/--/B POS (02/23 1551)  OB History     Gravida  7   Para  1   Term  1   Preterm  0   AB  5   Living  1      SAB  2   IAB  1   Ectopic  2   Multiple  0   Live Births  1           Past Medical History:  Diagnosis Date   Depression 03/08/2019   Generalized anxiety disorder 03/08/2019   Genital herpes    Left cornual ectopic pregnancy 05/16/2017   8 week viable cornual ruptured ectopic pregnancy with ~500 ml of hemoperitoneum -> wedge resection Needs cesarean deliveries at 37 weeks for any subsequent pregnancies; labor is absolutely contraindicated.    Past Surgical History:  Procedure Laterality Date   LAPAROSCOPY N/A 05/16/2017   Procedure: LAPAROSCOPY DIAGNOSTIC;  Surgeon: Herchel Gloris LABOR, MD;  Location: [facility];  Service: Gynecology;  Laterality: N/A;   LAPAROTOMY N/A 05/16/2017   Procedure: EXPLORATORY LAPAROTOMY, LEFT CORNUAL WEDGE RESECTION ECTOPIC PREGNANCY;  Surgeon: Herchel Gloris LABOR, MD;  Location: [facility];  Service: Gynecology;  Laterality: N/A;   UNILATERAL SALPINGECTOMY Left 05/16/2017   Procedure: UNILATERAL  SALPINGECTOMY;  Surgeon: Herchel Gloris LABOR, MD;  Location: [facility];  Service: Gynecology;  Laterality: Left;   WISDOM TOOTH EXTRACTION      Family History  Problem Relation Age of Onset   Lupus Mother    Other Mother        prediabetes   Diabetes Father        B leg amputation   Kidney disease Father    Diabetes Paternal Grandmother    Hypertension Paternal Grandmother    Diabetes Paternal Grandfather    Hypertension Paternal Grandfather    Diabetes Cousin    Hyperlipidemia Cousin     Social History   Socioeconomic History   Marital status: Significant Other    Spouse name: Not on file   Number of children: Not on file   Years of education: Not on file   Highest education level: Not on file  Occupational History   Not on file  Tobacco Use   Smoking status: Former    Current packs/day: 0.25    Average packs/day: 0.3 packs/day for 5.0 years (1.3 ttl pk-yrs)    Types: Cigarettes   Smokeless tobacco: Never  Vaping Use   Vaping status: Never Used  Substance and Sexual Activity   Alcohol use: Not Currently    Comment:  weekends   Drug use: Not Currently    Types: Marijuana    Comment: last useDec 1 2015   Sexual activity: Yes    Birth control/protection: None  Other Topics Concern   Not on file  Social History Narrative   Not on file   Social Drivers of Health   Tobacco Use: Medium Risk (03/02/2024)   Patient History    Smoking Tobacco Use: Former    Smokeless Tobacco Use: Never    Passive Exposure: Not on file  Financial Resource Strain: Not on file  Food Insecurity: Not on file  Transportation Needs: Not on file  Physical Activity: Not on file  Stress: Not on file (01/29/2023)  Social Connections: Not on file  Intimate Partner Violence: Not on file  Depression (PHQ2-9): Low Risk (03/02/2024)   Depression (PHQ2-9)    PHQ-2 Score: 0  Alcohol Screen: Not on file  Housing: Not on file  Utilities: Not on file  Health Literacy: Not on file     Allergies[1]  Medications Ordered Prior to Encounter[2]   ROS Pertinent positives and negative per HPI, all others reviewed and negative  Physical Exam   BP 128/69   Pulse 99   Temp 98.7 F (37.1 C)   Resp 20   Wt 95.3 kg   LMP 12/28/2023 (Approximate)   BMI 31.95 kg/m   Patient Vitals for the past 24 hrs:  BP Temp Pulse Resp Weight  03/07/24 1323 128/69 98.7 F (37.1 C) 99 20 95.3 kg    Physical Exam Vitals reviewed.  Constitutional:      General: She is not in acute distress.    Appearance: She is well-developed. She is not diaphoretic.  Eyes:     General: No scleral icterus. Pulmonary:     Effort: Pulmonary effort is normal. No respiratory distress.  Abdominal:     General: There is no distension.     Palpations: Abdomen is soft.     Tenderness: There is no abdominal tenderness. There is no guarding or rebound.  Skin:    General: Skin is warm and dry.  Neurological:     Mental Status: She is alert.     Coordination: Coordination normal.      Cervical Exam    Bedside Ultrasound Pt informed that the ultrasound is considered a limited OB ultrasound and is not intended to be a complete ultrasound exam.  Patient also informed that the ultrasound is not being completed with the intent of assessing for fetal or placental anomalies or any pelvic abnormalities.  Explained that the purpose of todays ultrasound is to assess for  viability.  Patient acknowledges the purpose of the exam and the limitations of the study.     My interpretation: viable IUP seen, FHR 171 bpm by M mode  FHT Not obtained  Labs Results for orders placed or performed during the hospital encounter of 03/07/24 (from the past 24 hours)  Urinalysis, Routine w reflex microscopic -Urine, Clean Catch     Status: Abnormal   Collection Time: 03/07/24  2:02 PM  Result Value Ref Range   Color, Urine YELLOW YELLOW   APPearance HAZY (A) CLEAR   Specific Gravity, Urine 1.029 1.005 - 1.030    pH 5.0 5.0 - 8.0   Glucose, UA NEGATIVE NEGATIVE mg/dL   Hgb urine dipstick NEGATIVE NEGATIVE   Bilirubin Urine NEGATIVE NEGATIVE   Ketones, ur NEGATIVE NEGATIVE mg/dL   Protein, ur 30 (A) NEGATIVE mg/dL   Nitrite NEGATIVE NEGATIVE  Leukocytes,Ua NEGATIVE NEGATIVE   RBC / HPF 0-5 0 - 5 RBC/hpf   WBC, UA 0-5 0 - 5 WBC/hpf   Bacteria, UA NONE SEEN NONE SEEN   Squamous Epithelial / HPF 0-5 0 - 5 /HPF   Mucus PRESENT     Imaging No results found.  MAU Course  Procedures Lab Orders         Urinalysis, Routine w reflex microscopic -Urine, Clean Catch    Meds ordered this encounter  Medications   ondansetron  (ZOFRAN -ODT) disintegrating tablet 8 mg   scopolamine  (TRANSDERM-SCOP) 1 MG/3DAYS    Sig: Place 1 patch (1 mg total) onto the skin every 3 (three) days.    Dispense:  10 patch    Refill:  1   metoCLOPramide  (REGLAN ) 10 MG tablet    Sig: Take 1 tablet (10 mg total) by mouth 4 (four) times daily as needed for nausea or vomiting.    Dispense:  30 tablet    Refill:  2   prochlorperazine  (COMPAZINE ) 10 MG tablet    Sig: Take 1 tablet (10 mg total) by mouth every 6 (six) hours as needed for nausea or vomiting.    Dispense:  30 tablet    Refill:  3   ondansetron  (ZOFRAN -ODT) 4 MG disintegrating tablet    Sig: Take 1 tablet (4 mg total) by mouth every 6 (six) hours as needed for nausea.    Dispense:  20 tablet    Refill:  3   Imaging Orders  No imaging studies ordered today    MDM Moderate (Level 3-4)  Assessment and Plan  #Abdominal pain in pregnancy, first trimester #[redacted] weeks gestation of pregnancy Viable IUP seen on bedside US , provided reassurance.  #Nausea and vomiting of pregnancy Minimal improvement with zofran . Suspect she may have a viral illness. Declined viral respiratory swab. Discussed she may need multi med regimen. Worried about growth of baby, reassured her that with normal UA she is drinking enough to stave off dehydration and that baby will grow even if  her intake is not the best. Multiple meds sent to pharmacy, preferred discharge to ongoing stay in MAU.    Dispo: discharged to home in stable condition    Donnice CHRISTELLA Carolus, MD/MPH 03/07/2024 4:31 PM  Allergies as of 03/07/2024   No Known Allergies      Medication List     TAKE these medications    Blood Pressure Kit Devi 1 Device by Does not apply route once a week.   Doxylamine -Pyridoxine  10-10 MG Tbec Commonly known as: Diclegis  Take 2 tablets by mouth at bedtime. If symptoms persist, add one tablet in the morning and one in the afternoon   metoCLOPramide  10 MG tablet Commonly known as: REGLAN  Take 1 tablet (10 mg total) by mouth 4 (four) times daily as needed for nausea or vomiting.   ondansetron  4 MG disintegrating tablet Commonly known as: ZOFRAN -ODT Take 1 tablet (4 mg total) by mouth every 6 (six) hours as needed for nausea.   prochlorperazine  10 MG tablet Commonly known as: COMPAZINE  Take 1 tablet (10 mg total) by mouth every 6 (six) hours as needed for nausea or vomiting.   promethazine  25 MG tablet Commonly known as: PHENERGAN  Take 1 tablet (25 mg total) by mouth every 6 (six) hours as needed for nausea or vomiting.   scopolamine  1 MG/3DAYS Commonly known as: TRANSDERM-SCOP Place 1 patch (1 mg total) onto the skin every 3 (three) days.   sertraline  25 MG tablet Commonly  known as: Zoloft  Take one tablet daily. If tolerating, increase to two tablets in one week.   valACYclovir  500 MG tablet Commonly known as: VALTREX  Take 500 mg by mouth as needed (for HSV outbreak).   Vitafol  Ultra 29-0.6-0.4-200 MG Caps Take 1 capsule by mouth daily.           [1] No Known Allergies [2]  No current facility-administered medications on file prior to encounter.   Current Outpatient Medications on File Prior to Encounter  Medication Sig Dispense Refill   Blood Pressure Monitoring (BLOOD PRESSURE KIT) DEVI 1 Device by Does not apply route once a week. 1  each 0   Doxylamine -Pyridoxine  (DICLEGIS ) 10-10 MG TBEC Take 2 tablets by mouth at bedtime. If symptoms persist, add one tablet in the morning and one in the afternoon 100 tablet 5   Prenat-Fe Poly-Methfol-FA-DHA (VITAFOL  ULTRA) 29-0.6-0.4-200 MG CAPS Take 1 capsule by mouth daily. (Patient not taking: Reported on 03/02/2024) 30 capsule 11   promethazine  (PHENERGAN ) 25 MG tablet Take 1 tablet (25 mg total) by mouth every 6 (six) hours as needed for nausea or vomiting. 30 tablet 1   sertraline  (ZOLOFT ) 25 MG tablet Take one tablet daily. If tolerating, increase to two tablets in one week. (Patient not taking: Reported on 06/08/2023) 180 tablet 0   valACYclovir  (VALTREX ) 500 MG tablet Take 500 mg by mouth as needed (for HSV outbreak).

## 2024-03-22 ENCOUNTER — Encounter: Payer: Self-pay | Admitting: Obstetrics and Gynecology

## 2024-03-22 ENCOUNTER — Ambulatory Visit (INDEPENDENT_AMBULATORY_CARE_PROVIDER_SITE_OTHER): Admitting: Obstetrics and Gynecology

## 2024-03-22 VITALS — BP 119/74 | HR 94 | Wt 216.0 lb

## 2024-03-22 DIAGNOSIS — B009 Herpesviral infection, unspecified: Secondary | ICD-10-CM

## 2024-03-22 DIAGNOSIS — O0990 Supervision of high risk pregnancy, unspecified, unspecified trimester: Secondary | ICD-10-CM

## 2024-03-22 DIAGNOSIS — Z3A12 12 weeks gestation of pregnancy: Secondary | ICD-10-CM

## 2024-03-22 DIAGNOSIS — O99341 Other mental disorders complicating pregnancy, first trimester: Secondary | ICD-10-CM

## 2024-03-22 DIAGNOSIS — O0991 Supervision of high risk pregnancy, unspecified, first trimester: Secondary | ICD-10-CM

## 2024-03-22 DIAGNOSIS — F32A Depression, unspecified: Secondary | ICD-10-CM

## 2024-03-22 DIAGNOSIS — F411 Generalized anxiety disorder: Secondary | ICD-10-CM

## 2024-03-22 DIAGNOSIS — Z8759 Personal history of other complications of pregnancy, childbirth and the puerperium: Secondary | ICD-10-CM | POA: Insufficient documentation

## 2024-03-22 MED ORDER — VITAFOL ULTRA 29-0.6-0.4-200 MG PO CAPS: 1.0000 | ORAL_CAPSULE | Freq: Every day | ORAL | 11 refills | Status: AC

## 2024-03-22 NOTE — Progress Notes (Signed)
 " Subjective:    Sara Rubio is a H2E8948 [redacted]w[redacted]d being seen today for her first obstetrical visit.  Her obstetrical history is significant for previous uncomplicated first pregnancy which resulted in an SVD followed by cornual ectopic pregnancy with wedge resection. Patient does intend to breast feed. Pregnancy history fully reviewed.  Patient reports no complaints.  Vitals:   03/22/24 0914  BP: 119/74  Pulse: 94  Weight: 216 lb (98 kg)    HISTORY: OB History  Gravida Para Term Preterm AB Living  7 1 1  0 5 1  SAB IAB Ectopic Multiple Live Births  2 1 2  0 1    # Outcome Date GA Lbr Len/2nd Weight Sex Type Anes PTL Lv  7 Current           6 SAB 04/2023          5 Ectopic 2020          4 Ectopic 2019          3 SAB 2017          2 IAB 2017          1 Term 10/20/14 [redacted]w[redacted]d / 00:44 7 lb 1.6 oz (3.221 kg) F Vag-Spont EPI  LIV   Past Medical History:  Diagnosis Date   Depression 03/08/2019   Generalized anxiety disorder 03/08/2019   Genital herpes    Left cornual ectopic pregnancy 05/16/2017   8 week viable cornual ruptured ectopic pregnancy with ~500 ml of hemoperitoneum -> wedge resection Needs cesarean deliveries at 37 weeks for any subsequent pregnancies; labor is absolutely contraindicated.   Past Surgical History:  Procedure Laterality Date   LAPAROSCOPY N/A 05/16/2017   Procedure: LAPAROSCOPY DIAGNOSTIC;  Surgeon: Herchel Gloris LABOR, MD;  Location: [facility];  Service: Gynecology;  Laterality: N/A;   LAPAROTOMY N/A 05/16/2017   Procedure: EXPLORATORY LAPAROTOMY, LEFT CORNUAL WEDGE RESECTION ECTOPIC PREGNANCY;  Surgeon: Herchel Gloris LABOR, MD;  Location: [facility];  Service: Gynecology;  Laterality: N/A;   UNILATERAL SALPINGECTOMY Left 05/16/2017   Procedure: UNILATERAL SALPINGECTOMY;  Surgeon: Herchel Gloris LABOR, MD;  Location: [facility];  Service: Gynecology;  Laterality: Left;   WISDOM TOOTH EXTRACTION     Family History  Problem Relation Age of Onset   Lupus Mother    Other  Mother        prediabetes   Diabetes Father        B leg amputation   Kidney disease Father    Diabetes Paternal Grandmother    Hypertension Paternal Grandmother    Diabetes Paternal Grandfather    Hypertension Paternal Grandfather    Diabetes Cousin    Hyperlipidemia Cousin      Exam    Uterus:   12-weeks  System: Breast:  normal appearance, no masses or tenderness   Skin: normal coloration and turgor, no rashes    Neurologic: oriented, no focal deficits   Extremities: normal strength, tone, and muscle mass   HEENT extra ocular movement intact   Mouth/Teeth mucous membranes moist, pharynx normal without lesions and dental hygiene good   Neck supple and no masses   Cardiovascular: regular rate and rhythm   Respiratory:  appears well, vitals normal, no respiratory distress, acyanotic, normal RR, chest clear, no wheezing, crepitations, rhonchi, normal symmetric air entry   Abdomen: soft, non-tender; bowel sounds normal; no masses,  no organomegaly   Urinary:       Assessment:    Pregnancy: [id number] Patient Active Problem List  Diagnosis Date Noted   History of ectopic pregnancy 03/22/2024   Supervision of high risk pregnancy, antepartum 03/02/2024   Herpes simplex viral infection 03/02/2024   Generalized anxiety disorder 03/08/2019   Depression 03/08/2019        Plan:     Initial labs drawn. Prenatal vitamins. Problem list reviewed and updated. Genetic Screening discussed : ordered.  Ultrasound discussed; fetal survey: scheduled. Due to history of left cornual wedge resection, patient was made aware of need for delivery by cesarean section at 37 weeks. She verbalized understanding Patient with a history of depression and anxiety- currently stable without medication  Follow up in 4 weeks. 50% of 30 min visit spent on counseling and coordination of care.     Jacque Byron 03/22/2024   "

## 2024-03-23 ENCOUNTER — Ambulatory Visit: Payer: Self-pay | Admitting: Obstetrics and Gynecology

## 2024-03-23 DIAGNOSIS — O0990 Supervision of high risk pregnancy, unspecified, unspecified trimester: Secondary | ICD-10-CM

## 2024-03-23 LAB — CBC/D/PLT+RPR+RH+ABO+RUBIGG...
Antibody Screen: NEGATIVE
Basophils Absolute: 0 x10E3/uL (ref 0.0–0.2)
Basos: 0 %
EOS (ABSOLUTE): 0 x10E3/uL (ref 0.0–0.4)
Eos: 0 %
HCV Ab: NONREACTIVE
HIV Screen 4th Generation wRfx: NONREACTIVE
Hematocrit: 36.7 % (ref 34.0–46.6)
Hemoglobin: 12.3 g/dL (ref 11.1–15.9)
Hepatitis B Surface Ag: NEGATIVE
Immature Grans (Abs): 0 x10E3/uL (ref 0.0–0.1)
Immature Granulocytes: 0 %
Lymphocytes Absolute: 1.4 x10E3/uL (ref 0.7–3.1)
Lymphs: 28 %
MCH: 30.4 pg (ref 26.6–33.0)
MCHC: 33.5 g/dL (ref 31.5–35.7)
MCV: 91 fL (ref 79–97)
Monocytes Absolute: 0.5 x10E3/uL (ref 0.1–0.9)
Monocytes: 10 %
Neutrophils Absolute: 3.1 x10E3/uL (ref 1.4–7.0)
Neutrophils: 62 %
Platelets: 301 x10E3/uL (ref 150–450)
RBC: 4.05 x10E6/uL (ref 3.77–5.28)
RDW: 11.7 % (ref 11.7–15.4)
RPR Ser Ql: NONREACTIVE
Rh Factor: POSITIVE
Rubella Antibodies, IGG: 7.88 {index}
WBC: 5.1 x10E3/uL (ref 3.4–10.8)

## 2024-03-23 LAB — HEMOGLOBIN A1C
Est. average glucose Bld gHb Est-mCnc: 120 mg/dL
Hgb A1c MFr Bld: 5.8 % — ABNORMAL HIGH (ref 4.8–5.6)

## 2024-03-23 LAB — HCV INTERPRETATION

## 2024-03-23 LAB — VITAMIN D 25 HYDROXY (VIT D DEFICIENCY, FRACTURES): Vit D, 25-Hydroxy: 31.7 ng/mL (ref 30.0–100.0)

## 2024-03-25 LAB — URINE CULTURE, OB REFLEX

## 2024-03-25 LAB — CULTURE, OB URINE

## 2024-03-27 LAB — PANORAMA PRENATAL TEST FULL PANEL:PANORAMA TEST PLUS 5 ADDITIONAL MICRODELETIONS: FETAL FRACTION: 10.2

## 2024-03-31 LAB — HORIZON CUSTOM: REPORT SUMMARY: POSITIVE — AB

## 2024-04-04 ENCOUNTER — Other Ambulatory Visit: Payer: Self-pay | Admitting: Obstetrics and Gynecology

## 2024-04-04 ENCOUNTER — Encounter: Payer: Self-pay | Admitting: Obstetrics and Gynecology

## 2024-04-04 DIAGNOSIS — Z148 Genetic carrier of other disease: Secondary | ICD-10-CM

## 2024-04-08 ENCOUNTER — Other Ambulatory Visit: Payer: Self-pay

## 2024-04-08 DIAGNOSIS — O0990 Supervision of high risk pregnancy, unspecified, unspecified trimester: Secondary | ICD-10-CM

## 2024-04-08 DIAGNOSIS — R7309 Other abnormal glucose: Secondary | ICD-10-CM

## 2024-04-09 LAB — GLUCOSE TOLERANCE, 2 HOURS W/ 1HR
Glucose, 1 hour: 141 mg/dL (ref 70–179)
Glucose, 2 hour: 114 mg/dL (ref 70–152)
Glucose, Fasting: 82 mg/dL (ref 70–91)

## 2024-04-11 ENCOUNTER — Ambulatory Visit: Payer: Self-pay | Admitting: Obstetrics and Gynecology

## 2024-04-11 DIAGNOSIS — O0990 Supervision of high risk pregnancy, unspecified, unspecified trimester: Secondary | ICD-10-CM

## 2024-04-22 ENCOUNTER — Ambulatory Visit: Payer: Self-pay | Admitting: Physician Assistant

## 2024-04-22 ENCOUNTER — Encounter: Payer: Self-pay | Admitting: Physician Assistant

## 2024-04-22 VITALS — BP 118/71 | HR 93 | Wt 220.6 lb

## 2024-04-22 DIAGNOSIS — F32A Depression, unspecified: Secondary | ICD-10-CM

## 2024-04-22 DIAGNOSIS — Z8759 Personal history of other complications of pregnancy, childbirth and the puerperium: Secondary | ICD-10-CM

## 2024-04-22 DIAGNOSIS — O0992 Supervision of high risk pregnancy, unspecified, second trimester: Secondary | ICD-10-CM | POA: Diagnosis not present

## 2024-04-22 DIAGNOSIS — Z3A16 16 weeks gestation of pregnancy: Secondary | ICD-10-CM | POA: Diagnosis not present

## 2024-04-22 DIAGNOSIS — Z148 Genetic carrier of other disease: Secondary | ICD-10-CM | POA: Diagnosis not present

## 2024-04-22 DIAGNOSIS — O99342 Other mental disorders complicating pregnancy, second trimester: Secondary | ICD-10-CM | POA: Diagnosis not present

## 2024-04-22 DIAGNOSIS — O0990 Supervision of high risk pregnancy, unspecified, unspecified trimester: Secondary | ICD-10-CM

## 2024-04-22 DIAGNOSIS — B009 Herpesviral infection, unspecified: Secondary | ICD-10-CM | POA: Diagnosis not present

## 2024-04-22 DIAGNOSIS — F411 Generalized anxiety disorder: Secondary | ICD-10-CM

## 2024-04-22 NOTE — Progress Notes (Signed)
 Pt reports she has had a lot of gas lately. She also reports headaches; tylenol  helps. Pt denies vision changes, dizziness, increased HR or SOB accompanying the headaches.   Pt reports prenatals have not been available at the pharmacy the past two times she went to collect her RX.   Pt would like to discuss AFP with provider today.

## 2024-04-22 NOTE — Progress Notes (Signed)
 "  PRENATAL VISIT NOTE  Subjective:  Sara Rubio is a 29 y.o. H2E8948 at [redacted]w[redacted]d being seen today for ongoing prenatal care.  She is currently monitored for the following issues for this low-risk pregnancy and has Generalized anxiety disorder; Depression; Supervision of high risk pregnancy, antepartum; Herpes simplex viral infection; History of ectopic pregnancy; and Carrier of spinal muscular atrophy on their problem list.  Patient reports headaches and nasal congestion. Contractions: Not present. Vag. Bleeding: None.  Movement: Present. Denies leaking of fluid.   The following portions of the patient's history were reviewed and updated as appropriate: allergies, current medications, past family history, past medical history, past social history, past surgical history and problem list.   Objective:   Vitals:   04/22/24 0941  BP: 118/71  Pulse: 93  Weight: 220 lb 9.6 oz (100.1 kg)    Fetal Status:  Fetal Heart Rate (bpm): 143   Movement: Present    General: Alert, oriented and cooperative. Patient is in no acute distress.  Skin: Skin is warm and dry. No rash noted.   Cardiovascular: Normal heart rate noted  Respiratory: Normal respiratory effort, no problems with respiration noted  Abdomen: Soft, gravid, appropriate for gestational age.  Pain/Pressure: Present     Pelvic: Cervical exam deferred        Extremities: Normal range of motion.  Edema: None  Mental Status: Normal mood and affect. Normal behavior. Normal judgment and thought content.      03/22/2024    9:34 AM 03/02/2024    1:41 PM 07/20/2019    8:51 AM  Depression screen PHQ 2/9  Decreased Interest 1 0 1  Down, Depressed, Hopeless  0 1  PHQ - 2 Score 1 0 2  Altered sleeping 1 0 2  Tired, decreased energy 1 0 1  Change in appetite 0 0 0  Feeling bad or failure about yourself  0 0 1  Trouble concentrating 0 0 1  Moving slowly or fidgety/restless 0 0 0  Suicidal thoughts  0 0  PHQ-9 Score 3 0 7      Data saved  with a previous flowsheet row definition        03/22/2024    9:35 AM 03/02/2024    1:42 PM 07/20/2019    8:51 AM 04/19/2019    2:00 PM  GAD 7 : Generalized Anxiety Score  Nervous, Anxious, on Edge 1  1  1  3    Control/stop worrying 1  1  1  3    Worry too much - different things 1  1  1  3    Trouble relaxing 0  0  2  3   Restless 0  0  0  3   Easily annoyed or irritable 2  0  1  3   Afraid - awful might happen 0  0  0  3   Total GAD 7 Score 5 3 6 21      Data saved with a previous flowsheet row definition    Assessment and Plan:  Pregnancy: H2E8948 at [redacted]w[redacted]d  1. Supervision of high risk pregnancy, antepartum (Primary) Patient doing well BP, FHR appropriate  Pt politely declines AFP  2. [redacted] weeks gestation of pregnancy Anticipatory guidance about next visits/weeks of pregnancy given.   3. Carrier of spinal muscular atrophy Desire FOB testing   4. History of ectopic pregnancy CSD needed per operative note  5. Herpes simplex viral infection Ppx at 36 weeks   6. Depression, unspecified depression  type 7. Generalized anxiety disorder Stable  Preterm labor symptoms and general obstetric precautions including but not limited to vaginal bleeding, contractions, leaking of fluid and fetal movement were reviewed in detail with the patient.  Please refer to After Visit Summary for other counseling recommendations.   No follow-ups on file.  Future Appointments  Date Time Provider Department Center  05/10/2024  2:00 PM [facility] PROVIDER 1 [facility]  05/10/2024  2:30 PM [facility] US2 [facility]  05/10/2024  3:30 PM [facility] GENETIC COUNSELING RM [facility]  05/20/2024 11:15 AM Telvin Reinders E, PA-C [facility] None    Jorene FORBES Moats, PA-C  "

## 2024-04-22 NOTE — Patient Instructions (Signed)
 Hi Sara Rubio, it was a pleasure meeting you today,   Try Flonase temporarily for nasal congestion Try excedrin tension for headaches

## 2024-05-10 ENCOUNTER — Ambulatory Visit

## 2024-05-10 ENCOUNTER — Other Ambulatory Visit

## 2024-05-20 ENCOUNTER — Encounter: Payer: Self-pay | Admitting: Physician Assistant
